# Patient Record
Sex: Female | Born: 1965 | Race: Black or African American | Hispanic: No | Marital: Single | Smoking: Never smoker
Health system: Southern US, Community
[De-identification: ages and names within clinical notes are randomized; demographics above are authoritative.]

## PROBLEM LIST (undated history)

## (undated) ENCOUNTER — Ambulatory Visit (HOSPITAL_COMMUNITY): Discharge status: Absent without leave | Payer: BC Managed Care – PPO

## (undated) DIAGNOSIS — K219 Gastro-esophageal reflux disease without esophagitis: Secondary | ICD-10-CM

## (undated) DIAGNOSIS — M5432 Sciatica, left side: Secondary | ICD-10-CM

## (undated) DIAGNOSIS — J309 Allergic rhinitis, unspecified: Secondary | ICD-10-CM

## (undated) DIAGNOSIS — E559 Vitamin D deficiency, unspecified: Secondary | ICD-10-CM

## (undated) DIAGNOSIS — I10 Essential (primary) hypertension: Secondary | ICD-10-CM

## (undated) DIAGNOSIS — G5600 Carpal tunnel syndrome, unspecified upper limb: Secondary | ICD-10-CM

## (undated) HISTORY — DX: Allergic rhinitis, unspecified: J30.9

## (undated) HISTORY — DX: Sciatica, left side: M54.32

## (undated) HISTORY — DX: Carpal tunnel syndrome, unspecified upper limb: G56.00

## (undated) HISTORY — PX: BREAST BIOPSY: SHX20

## (undated) HISTORY — DX: Gastro-esophageal reflux disease without esophagitis: K21.9

## (undated) HISTORY — DX: Morbid (severe) obesity due to excess calories: E66.01

## (undated) HISTORY — DX: Vitamin D deficiency, unspecified: E55.9

## (undated) HISTORY — DX: Essential (primary) hypertension: I10

---

## 1999-03-11 ENCOUNTER — Ambulatory Visit (HOSPITAL_COMMUNITY): Admission: RE | Admit: 1999-03-11 | Discharge: 1999-03-11 | Payer: Self-pay | Admitting: Gastroenterology

## 1999-04-19 ENCOUNTER — Other Ambulatory Visit: Admission: RE | Admit: 1999-04-19 | Discharge: 1999-04-19 | Payer: Self-pay | Admitting: Obstetrics and Gynecology

## 1999-05-13 ENCOUNTER — Other Ambulatory Visit: Admission: RE | Admit: 1999-05-13 | Discharge: 1999-05-13 | Payer: Self-pay | Admitting: Obstetrics and Gynecology

## 1999-05-13 ENCOUNTER — Encounter (INDEPENDENT_AMBULATORY_CARE_PROVIDER_SITE_OTHER): Payer: Self-pay | Admitting: Specialist

## 1999-09-03 ENCOUNTER — Other Ambulatory Visit: Admission: RE | Admit: 1999-09-03 | Discharge: 1999-09-03 | Payer: Self-pay | Admitting: Obstetrics and Gynecology

## 1999-09-06 ENCOUNTER — Other Ambulatory Visit: Admission: RE | Admit: 1999-09-06 | Discharge: 1999-09-06 | Payer: Self-pay | Admitting: Obstetrics and Gynecology

## 1999-09-06 ENCOUNTER — Encounter (INDEPENDENT_AMBULATORY_CARE_PROVIDER_SITE_OTHER): Payer: Self-pay | Admitting: Specialist

## 2000-01-10 ENCOUNTER — Other Ambulatory Visit: Admission: RE | Admit: 2000-01-10 | Discharge: 2000-01-10 | Payer: Self-pay | Admitting: Obstetrics and Gynecology

## 2000-06-16 ENCOUNTER — Other Ambulatory Visit: Admission: RE | Admit: 2000-06-16 | Discharge: 2000-06-16 | Payer: Self-pay | Admitting: Obstetrics and Gynecology

## 2001-01-02 ENCOUNTER — Other Ambulatory Visit: Admission: RE | Admit: 2001-01-02 | Discharge: 2001-01-02 | Payer: Self-pay | Admitting: Obstetrics and Gynecology

## 2002-08-12 ENCOUNTER — Other Ambulatory Visit: Admission: RE | Admit: 2002-08-12 | Discharge: 2002-08-12 | Payer: Self-pay | Admitting: Obstetrics and Gynecology

## 2003-10-18 ENCOUNTER — Emergency Department (HOSPITAL_COMMUNITY): Admission: EM | Admit: 2003-10-18 | Discharge: 2003-10-18 | Payer: Self-pay | Admitting: Emergency Medicine

## 2004-04-28 ENCOUNTER — Other Ambulatory Visit: Admission: RE | Admit: 2004-04-28 | Discharge: 2004-04-28 | Payer: Self-pay | Admitting: Family Medicine

## 2005-11-13 ENCOUNTER — Emergency Department (HOSPITAL_COMMUNITY): Admission: EM | Admit: 2005-11-13 | Discharge: 2005-11-13 | Payer: Self-pay | Admitting: Emergency Medicine

## 2006-10-11 ENCOUNTER — Emergency Department (HOSPITAL_COMMUNITY): Admission: EM | Admit: 2006-10-11 | Discharge: 2006-10-11 | Payer: Self-pay | Admitting: Emergency Medicine

## 2007-11-02 ENCOUNTER — Emergency Department (HOSPITAL_COMMUNITY): Admission: EM | Admit: 2007-11-02 | Discharge: 2007-11-02 | Payer: Self-pay | Admitting: Family Medicine

## 2008-05-15 ENCOUNTER — Emergency Department (HOSPITAL_COMMUNITY): Admission: EM | Admit: 2008-05-15 | Discharge: 2008-05-15 | Payer: Self-pay | Admitting: Family Medicine

## 2008-05-21 ENCOUNTER — Encounter: Admission: RE | Admit: 2008-05-21 | Discharge: 2008-05-21 | Payer: Self-pay | Admitting: Family Medicine

## 2008-05-21 IMAGING — CR DG CERVICAL SPINE COMPLETE 4+V
8 of 10 series · 8 of 10 positions shown · non-contrast
Comparison: None

CLINICAL DATA: Severe neck pain.

CERVICAL SPINE - COMPLETE 4+ VIEW

[view not recorded (1 of 8)]
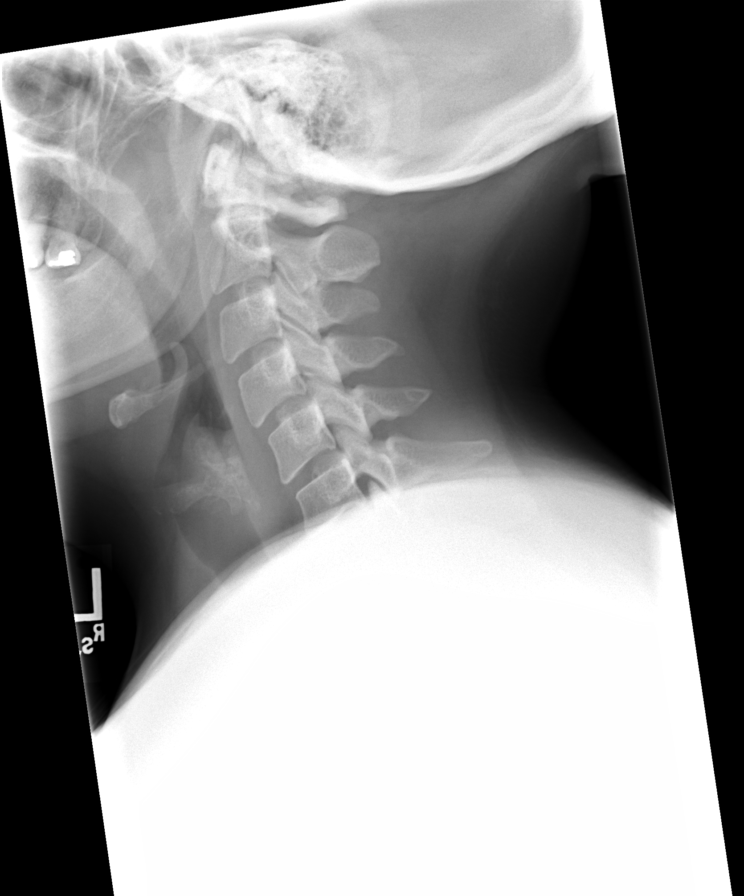

[view not recorded (2 of 8)]
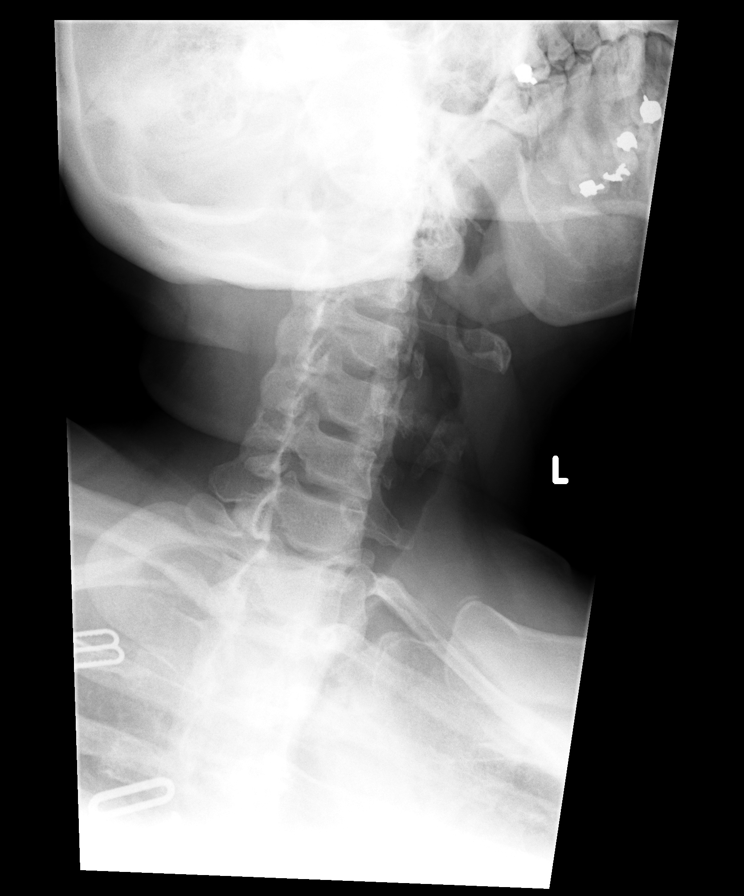

[view not recorded (3 of 8)]
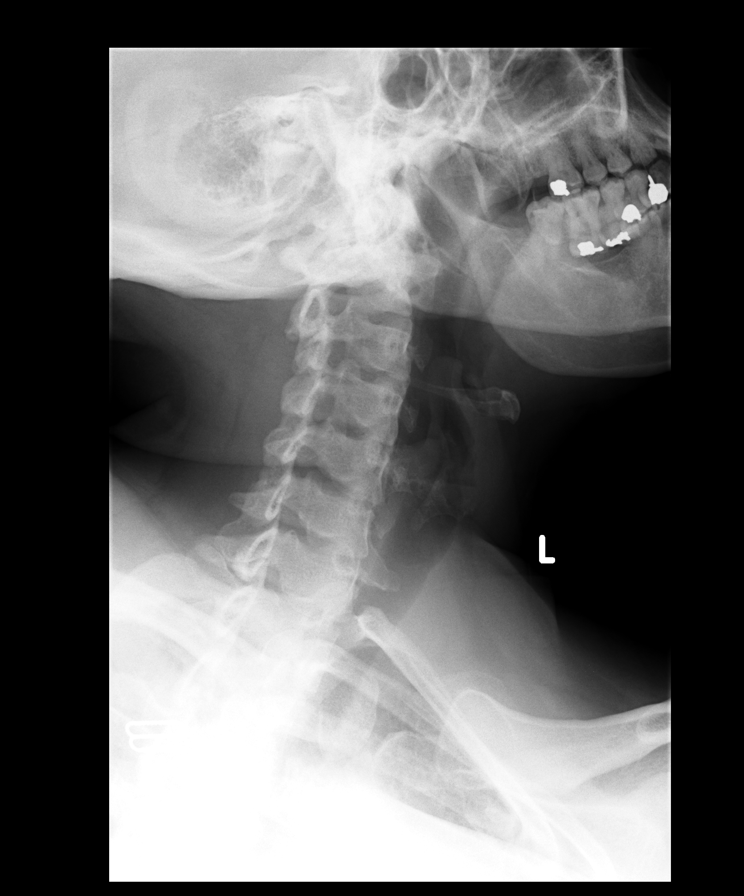

[view not recorded (4 of 8)]
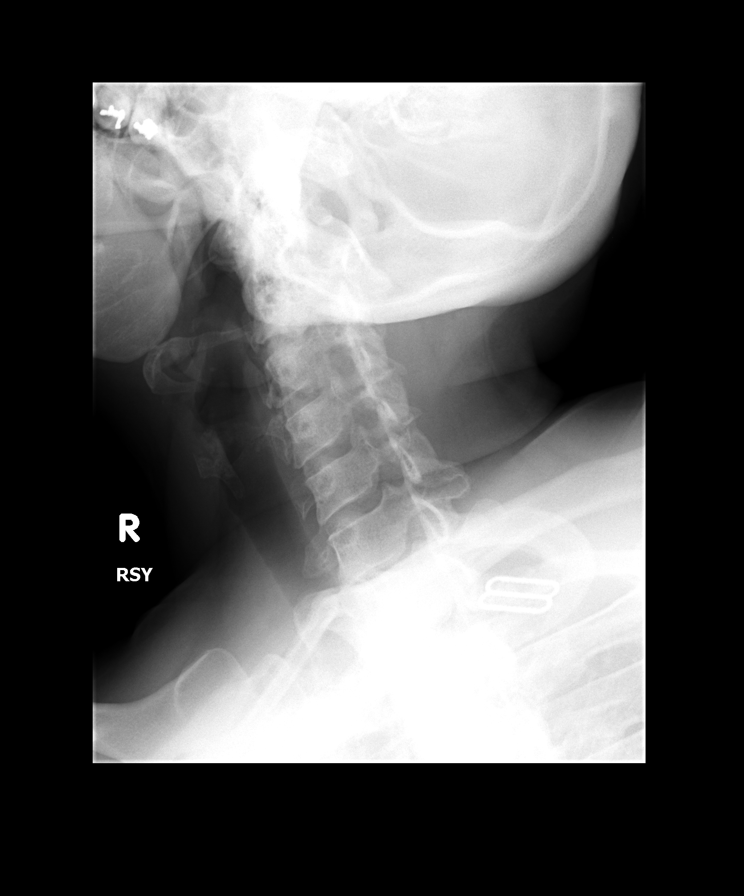

[view not recorded (5 of 8)]
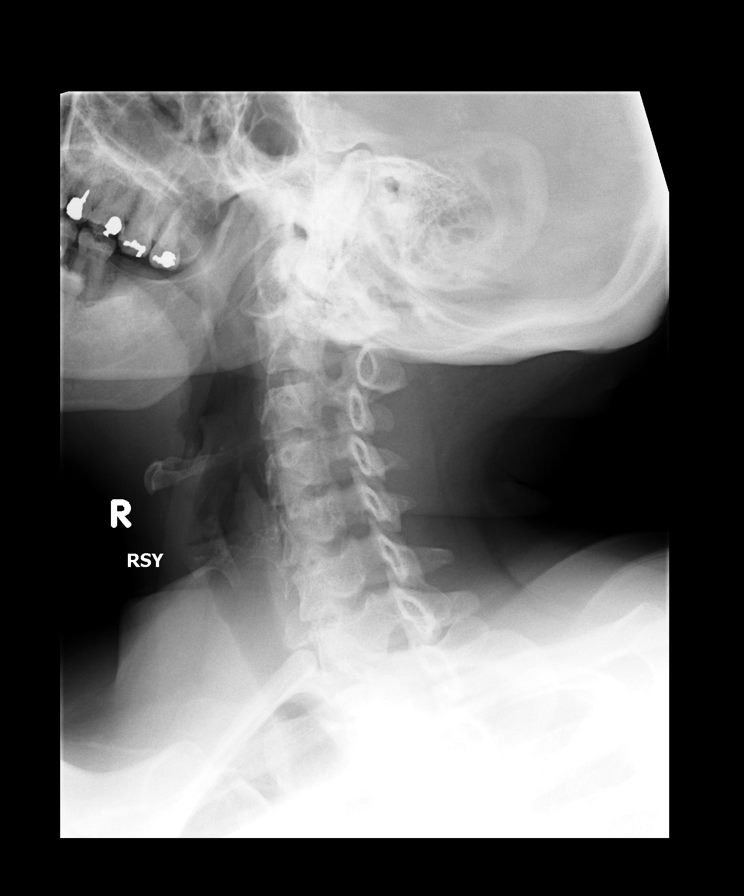

[view not recorded (6 of 8)]
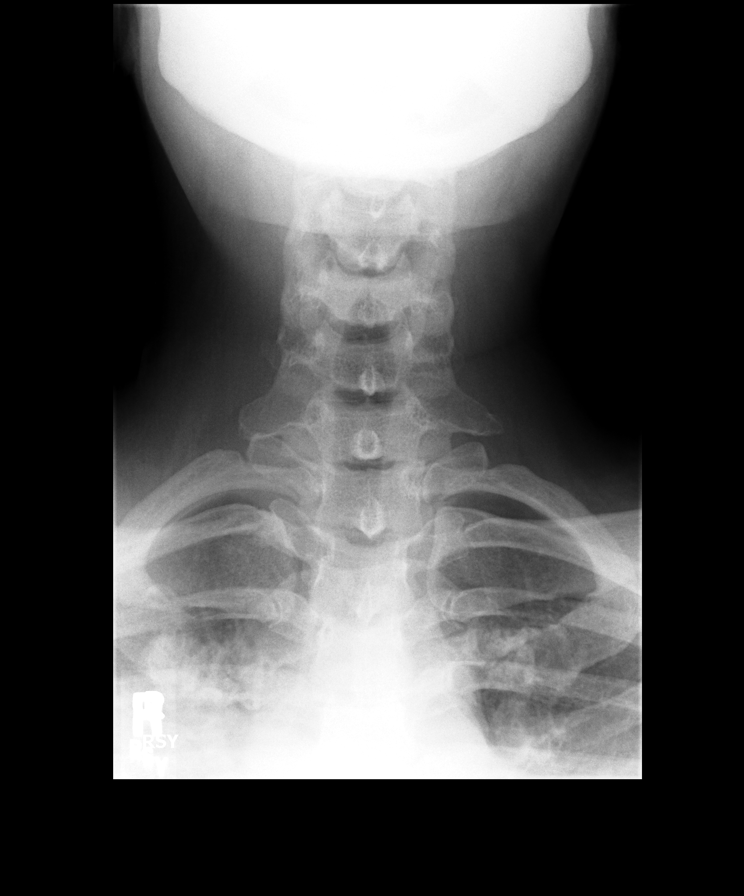

[view not recorded (7 of 8)]
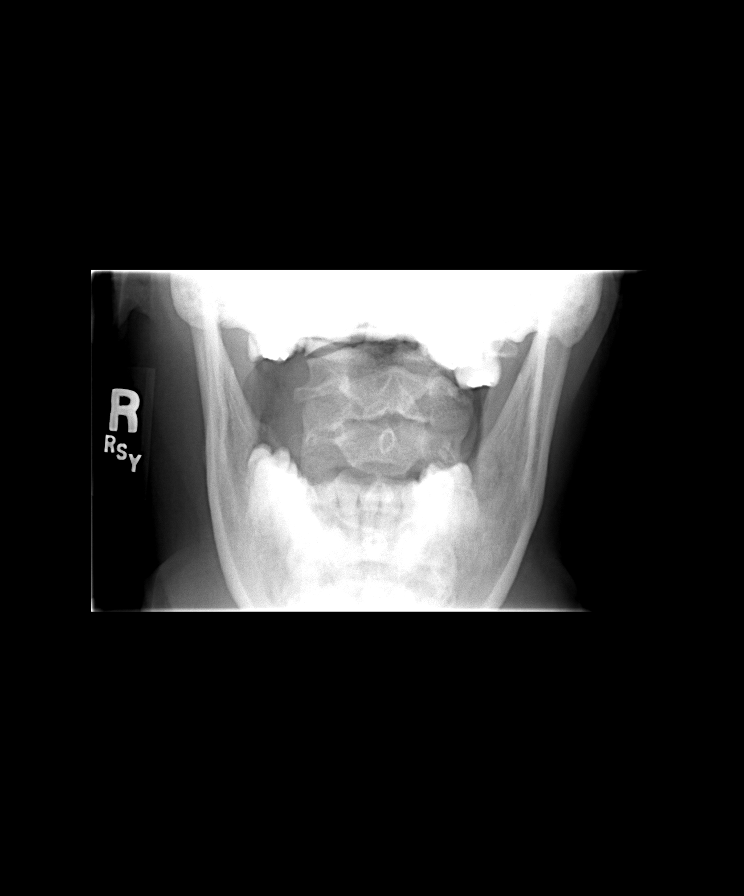

[view not recorded (8 of 8)]
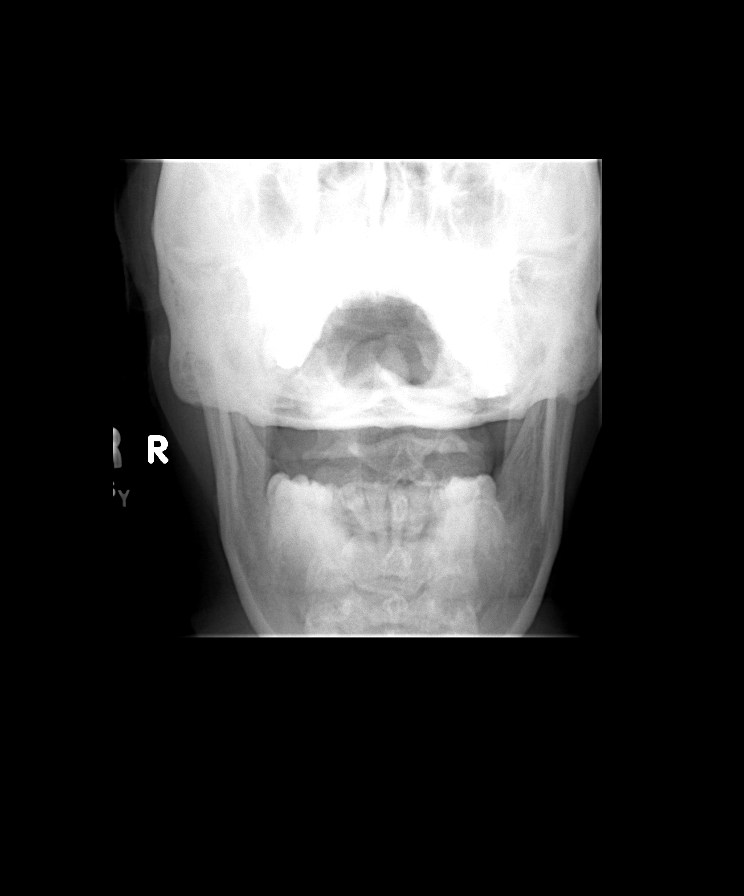

[8 of 10 positions shown; findings below may reference images not displayed]

FINDINGS: Cervical spine is visualized from the occiput to the C7-
T1 junction.  Prevertebral soft tissues are within normal limits,
alignment is anatomic and there is no fracture.  Neural foramina
appear patent.  Dens is obscured on the dedicated views.
Visualized lung bases show rounded densities in the medial right
upper lung zone.
IMPRESSION: 1.  Rounded densities in the medial right upper lung zone.  Two-
view chest x-ray is recommended in further initial evaluation, as
clinically indicated.
2.  No fracture or subluxation.  No acute findings in the neck.

## 2008-05-21 IMAGING — CR DG CHEST 2V
2 series · 2 of 2 positions shown · non-contrast
Comparison: [DATE]

CLINICAL DATA: Possible right upper lung nodule shown on cervical
spine radiographs.

CHEST - 2 VIEW

[view not recorded (1 of 2)]
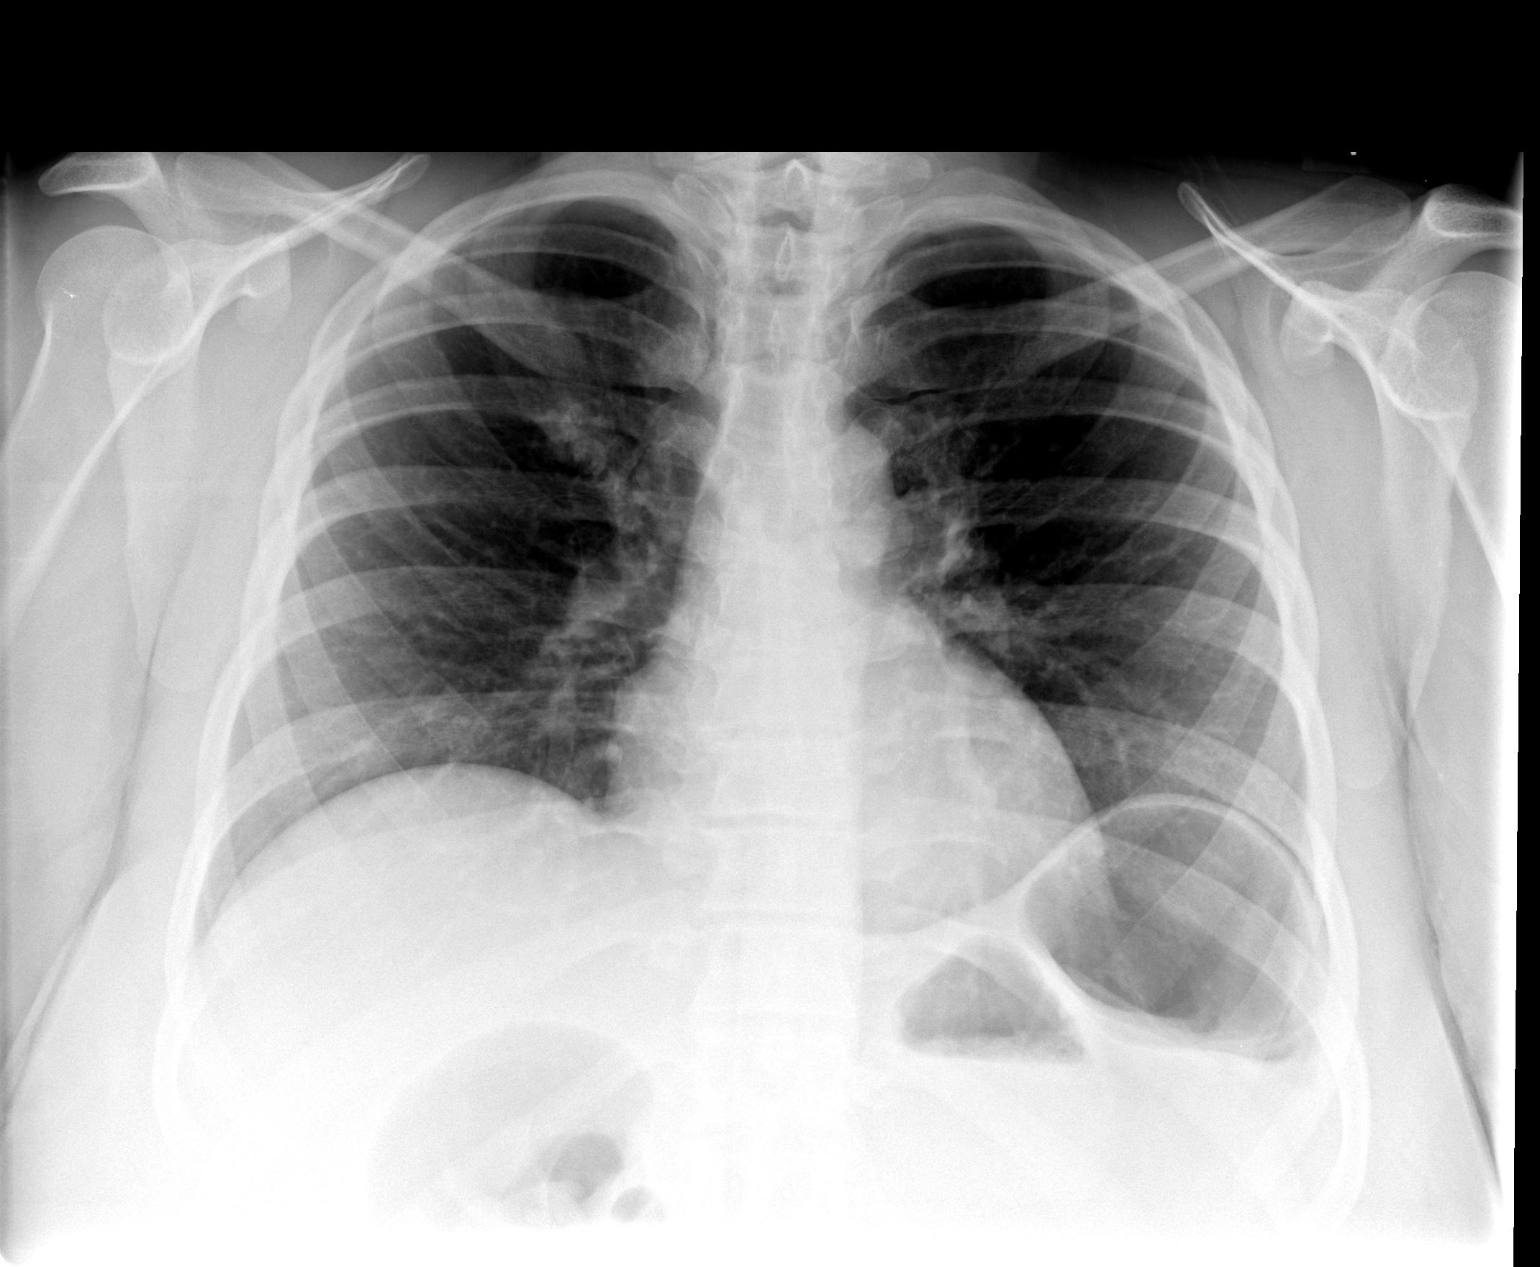

[view not recorded (2 of 2)]
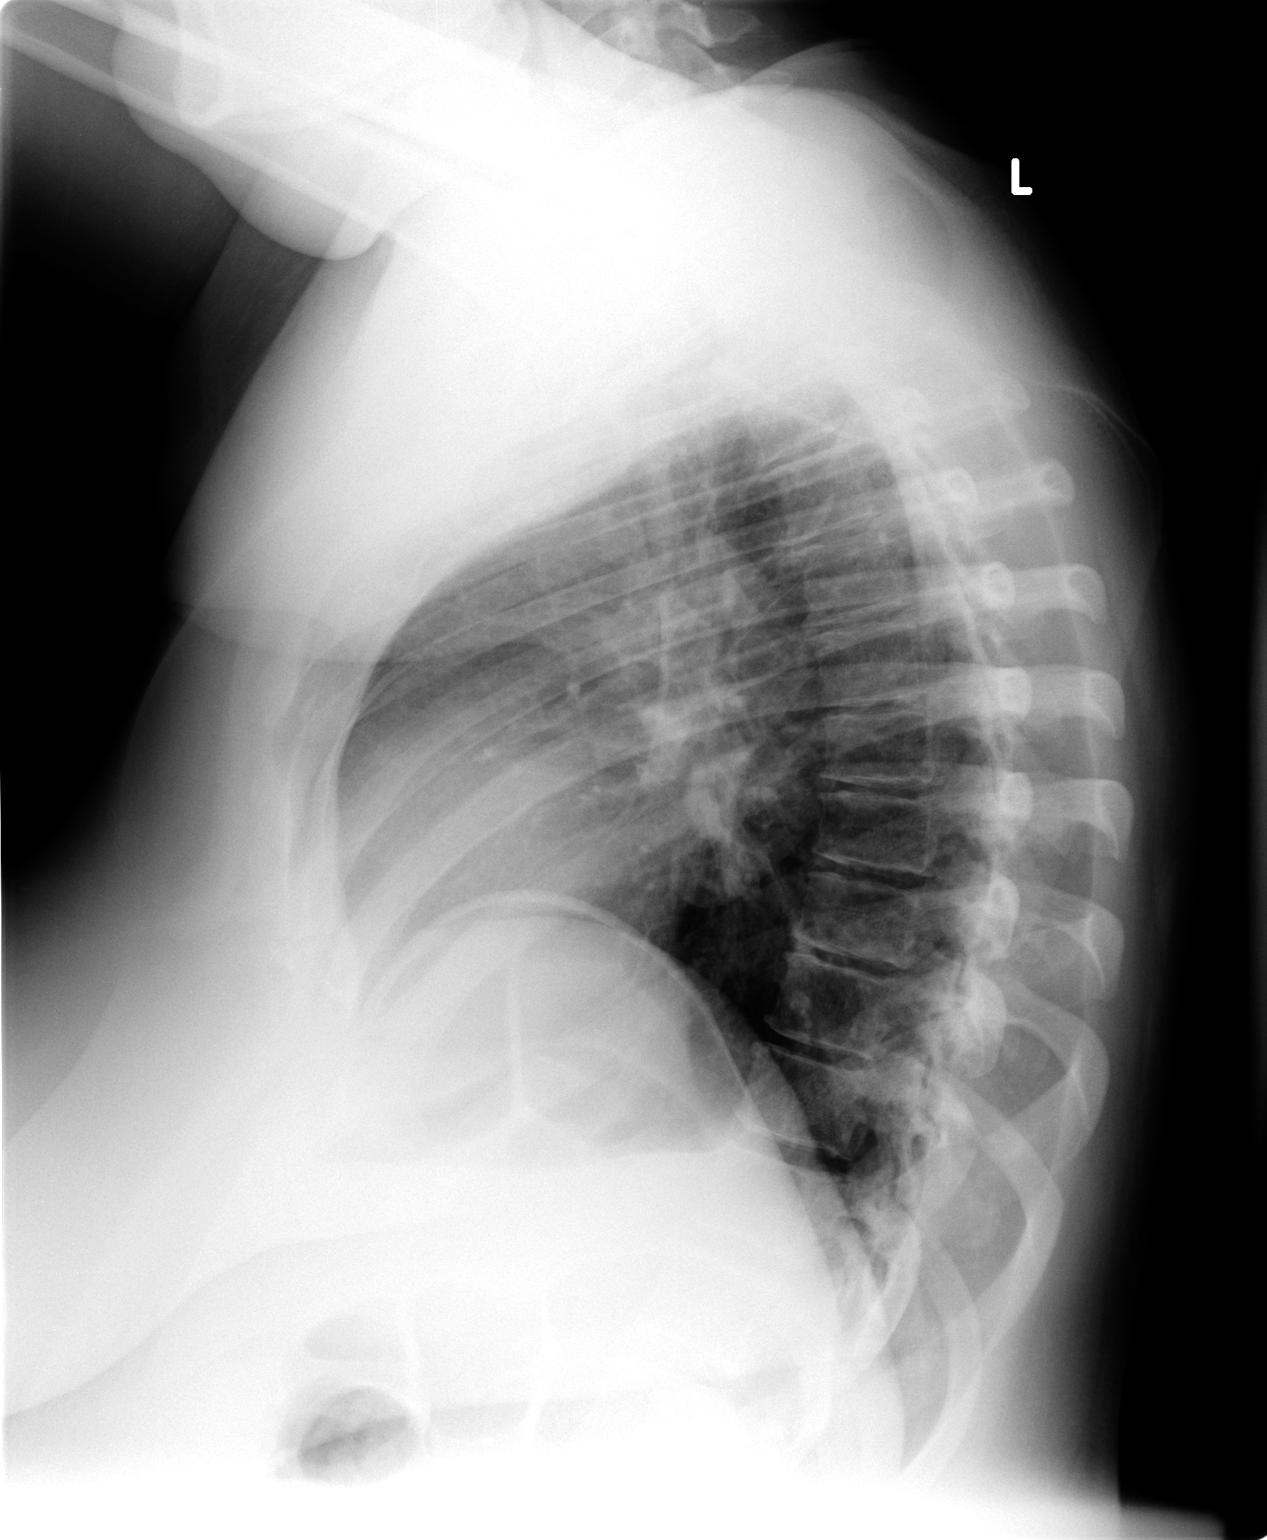

[2 of 2 positions shown; findings below may reference images not displayed]

FINDINGS: Continued nodular appearance over the right first rib and
likely represents a combination of bony and osseous shadows, but is
asymmetric and an underlying pulmonary nodule is not confidently
excluded.  Accordingly, CT the chest is recommended

The lungs appear otherwise clear.  No pleural effusion identified.
Cardiac and mediastinal margins appear normal.
IMPRESSION: 1.  Continued nodular appearance over the right first rib band,
statistically likely to represent confluence of bony and osseous
shadows, but for which a true intrapulmonary nodule cannot be
excluded.  CT of the chest is recommended for definitive workup.

## 2008-05-22 ENCOUNTER — Encounter: Admission: RE | Admit: 2008-05-22 | Discharge: 2008-05-22 | Payer: Self-pay | Admitting: Family Medicine

## 2008-05-22 IMAGING — CT CT CHEST W/ CM
2 of 3 series · 15 of 36 positions shown, 18 images · IV contrast (75CC OMNI 300)
Comparison: [REDACTED] cervical spine radiographs and chest x-ray
[DATE]

CLINICAL DATA: Question right suprahilar upper lobe nodule [REDACTED]
cervical spine radiographs [DATE] and chest x-ray [DATE].

CT CHEST WITH CONTRAST
TECHNIQUE: Multidetector CT imaging of the chest was performed
following the standard protocol during bolus administration of
intravenous contrast.
Contrast: 75 ml [UB]

[Series 2: routine chest · axial · 0.70mm/px · z∈[-226,+14]mm · 12 of 58 slices shown, 15 images]
[im 5/58  mediastinal]
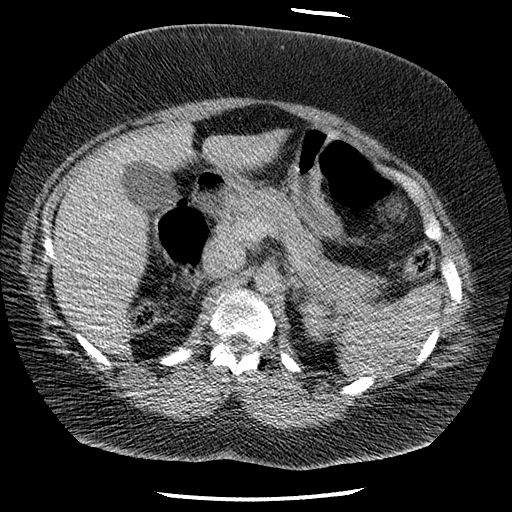
[im 5/58  lung]
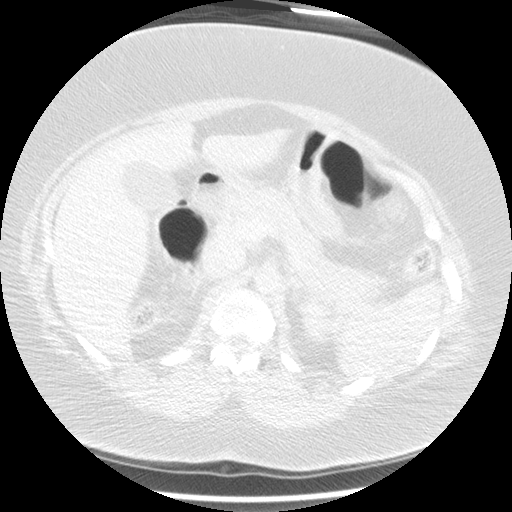
[im 9/58  lung]
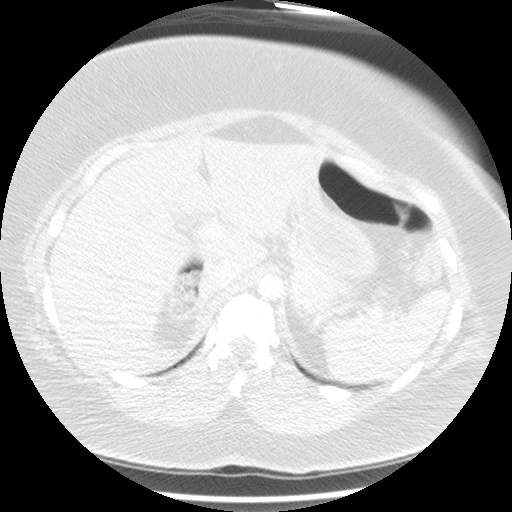
[im 13/58  lung]
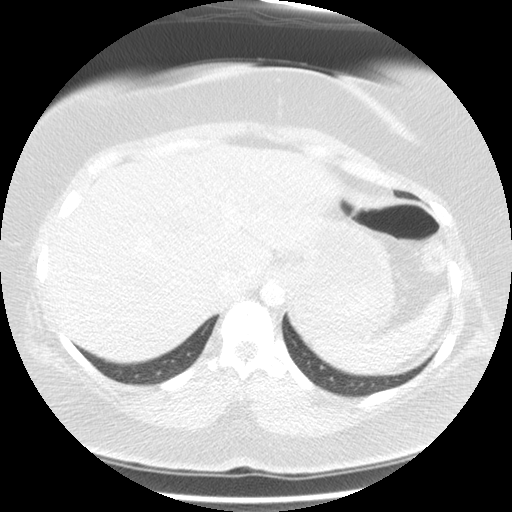
[im 17/58  lung]
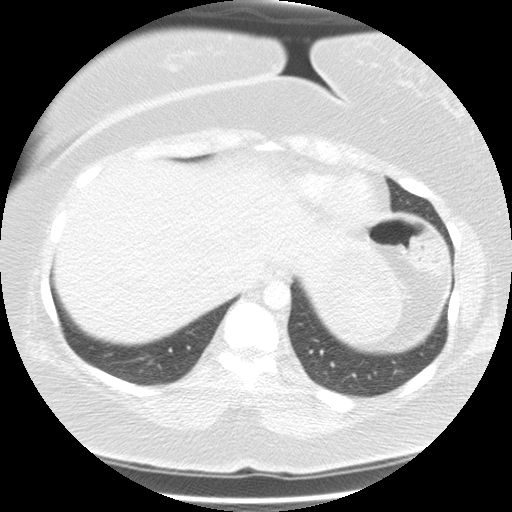
[im 22/58  mediastinal]
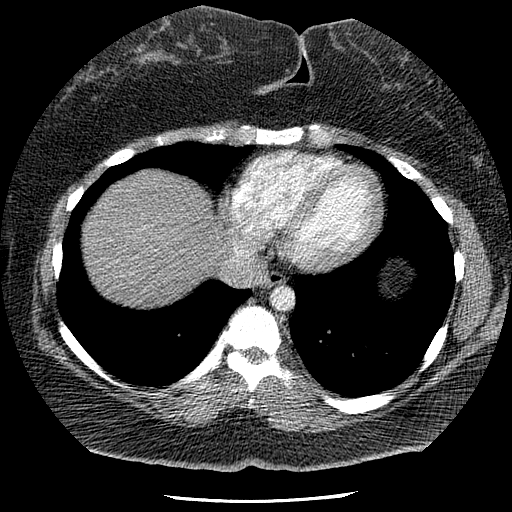
[im 22/58  lung]
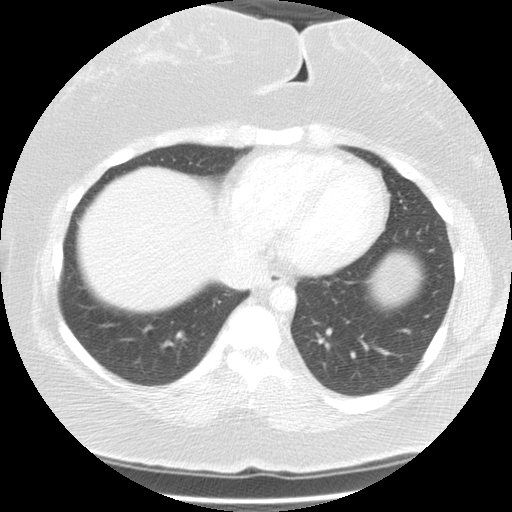
[im 26/58  lung]
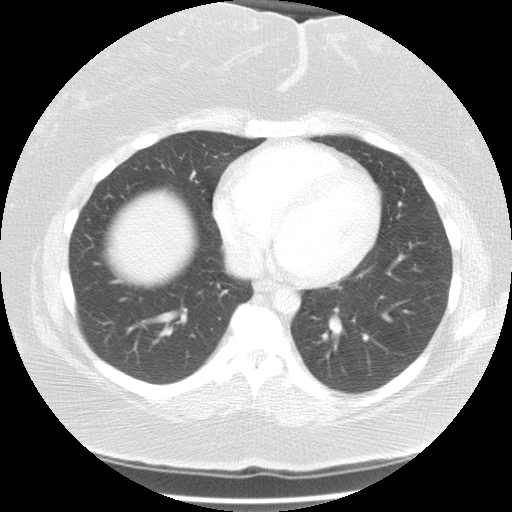
[im 32/58  lung]
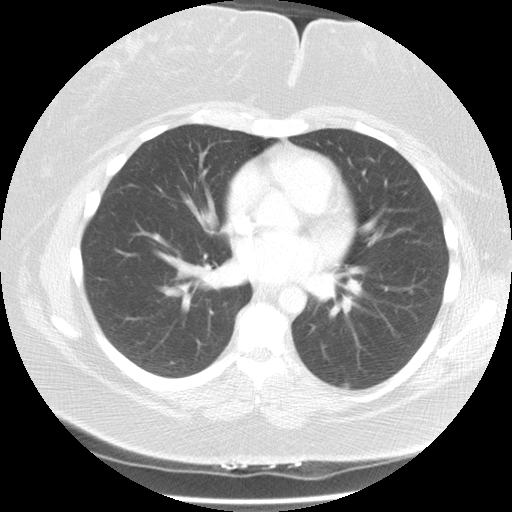
[im 36/58  lung]
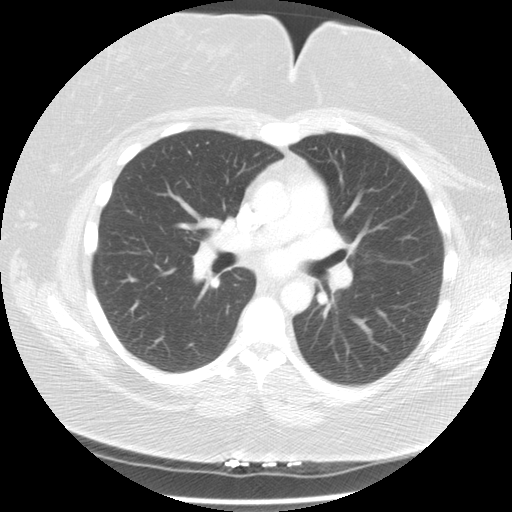
[im 41/58  mediastinal]
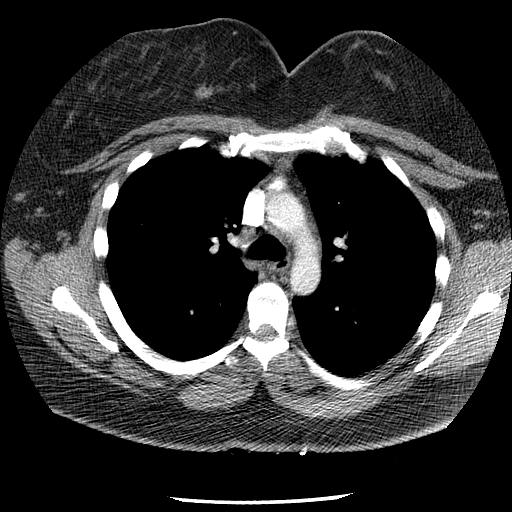
[im 41/58  lung]
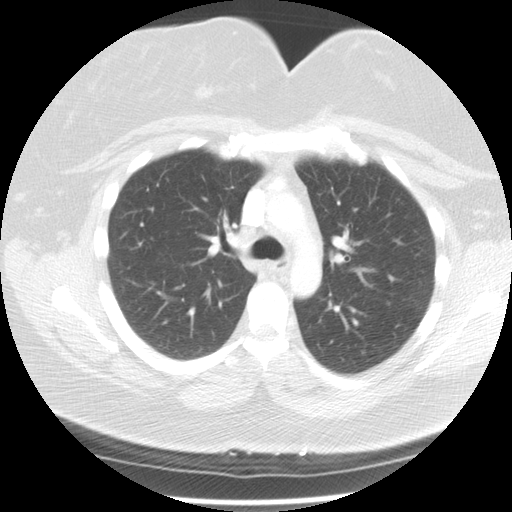
[im 45/58  lung]
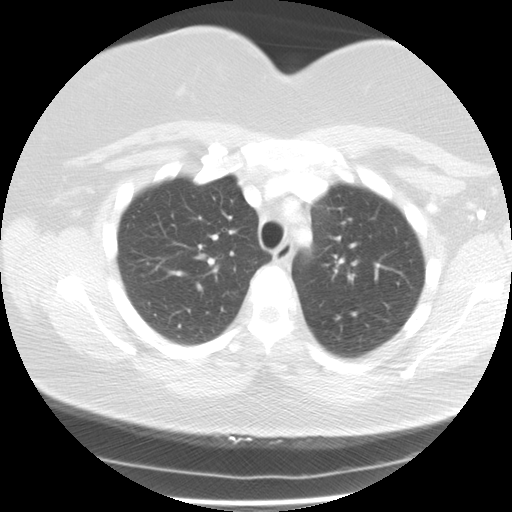
[im 49/58  lung]
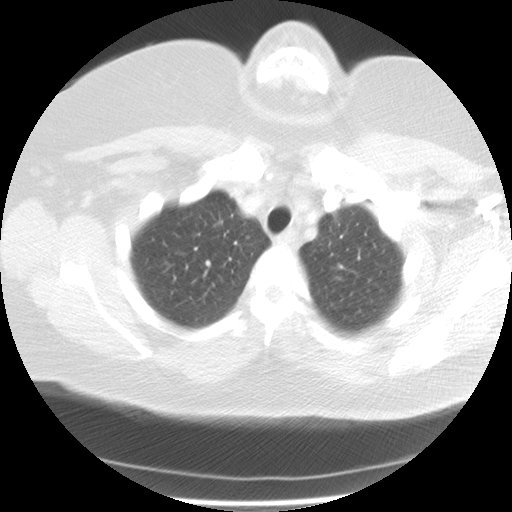
[im 53/58  lung]
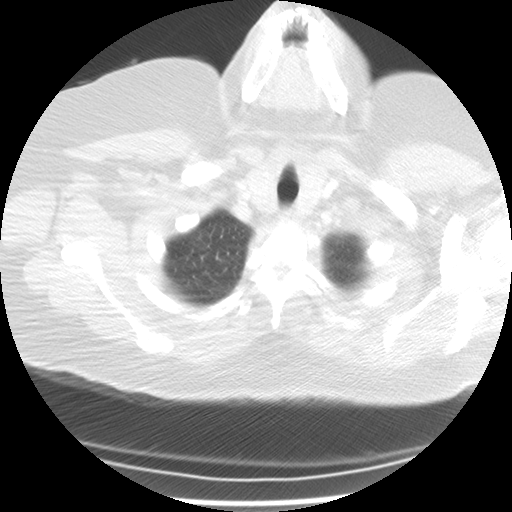

[Series 400: cor · coronal · 0.70mm/px · 3 of 156 slices shown]
[im 32/156  lung]
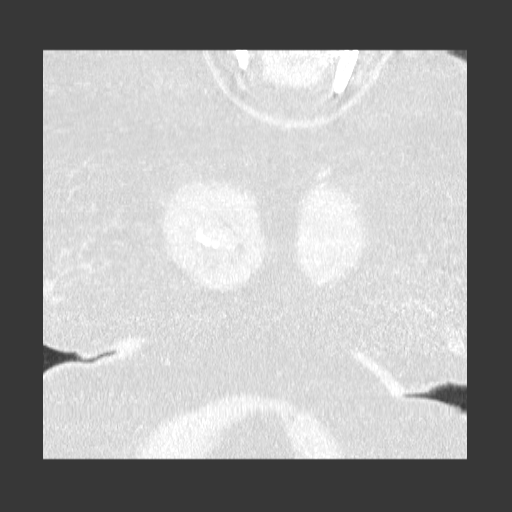
[im 63/156  lung]
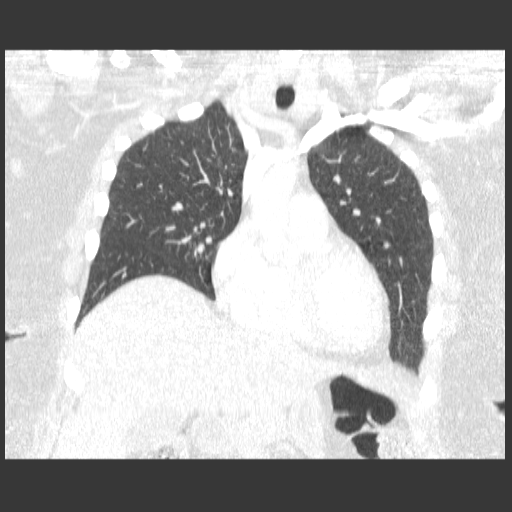
[im 94/156  lung]
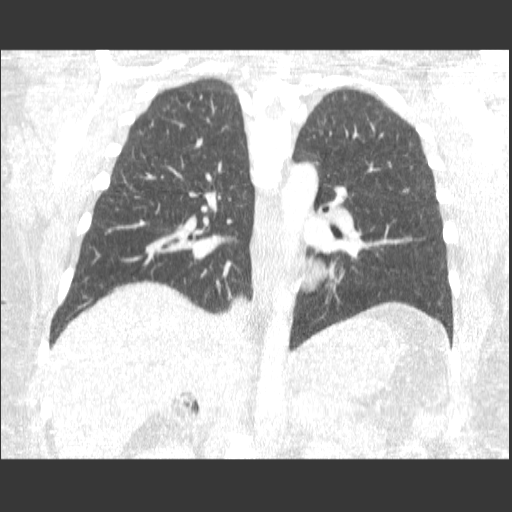

[15 of 36 positions shown; findings below may reference images not displayed]

FINDINGS: Lungs are clear.  Previous radiographic findings are due
to confluence of benign costochondral cartilage calcification at
the anterior right first rib and normal lung vasculature.  No
mediastinal, hilar or axillary mass/adenopathy is seen.  No
significant osseous lesions are noted.  Heart size is normal.
Upper abdominal organs appear normal..
IMPRESSION: Negative.  (Previous finding due to summation rib and vascular
shadows).

## 2008-06-04 ENCOUNTER — Encounter: Admission: RE | Admit: 2008-06-04 | Discharge: 2008-06-25 | Payer: Self-pay | Admitting: Family Medicine

## 2011-11-07 ENCOUNTER — Other Ambulatory Visit: Payer: Self-pay | Admitting: Obstetrics and Gynecology

## 2011-11-07 DIAGNOSIS — Z1231 Encounter for screening mammogram for malignant neoplasm of breast: Secondary | ICD-10-CM

## 2011-11-17 ENCOUNTER — Ambulatory Visit
Admission: RE | Admit: 2011-11-17 | Discharge: 2011-11-17 | Disposition: A | Discharge status: Home / Self Care | Payer: BC Managed Care – PPO | Source: Ambulatory Visit | Attending: Obstetrics and Gynecology | Admitting: Obstetrics and Gynecology

## 2011-11-17 DIAGNOSIS — Z1231 Encounter for screening mammogram for malignant neoplasm of breast: Secondary | ICD-10-CM

## 2011-11-17 IMAGING — MG MM DIGITAL SCREENING BILAT
4 series · 4 of 4 positions shown · non-contrast
Comparison: none

DG SCREEN MAMMOGRAM BILATERAL
Bilateral CC and MLO view(s) were taken.

DIGITAL SCREENING MAMMOGRAM WITH CAD:
The breast tissue is heterogeneously dense.  Possible asymmetry is noted in the left breast.  Spot 
compression views and possibly sonography are recommended for further evaluation.  In the right 
breast, no masses or malignant type calcifications are identified.
Images were processed with CAD.

[R CC]
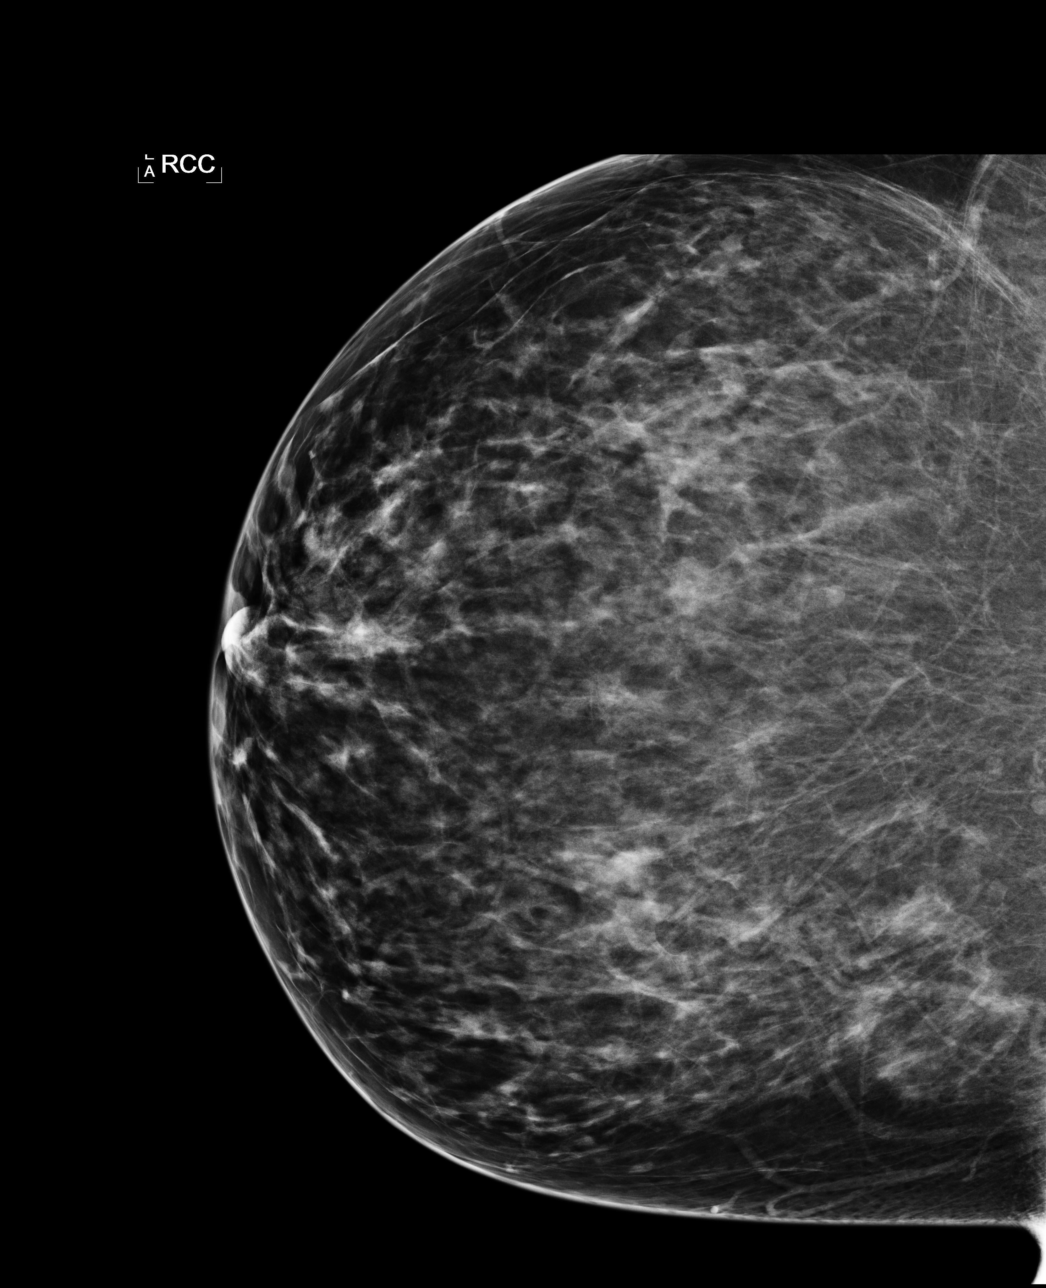

[L CC]
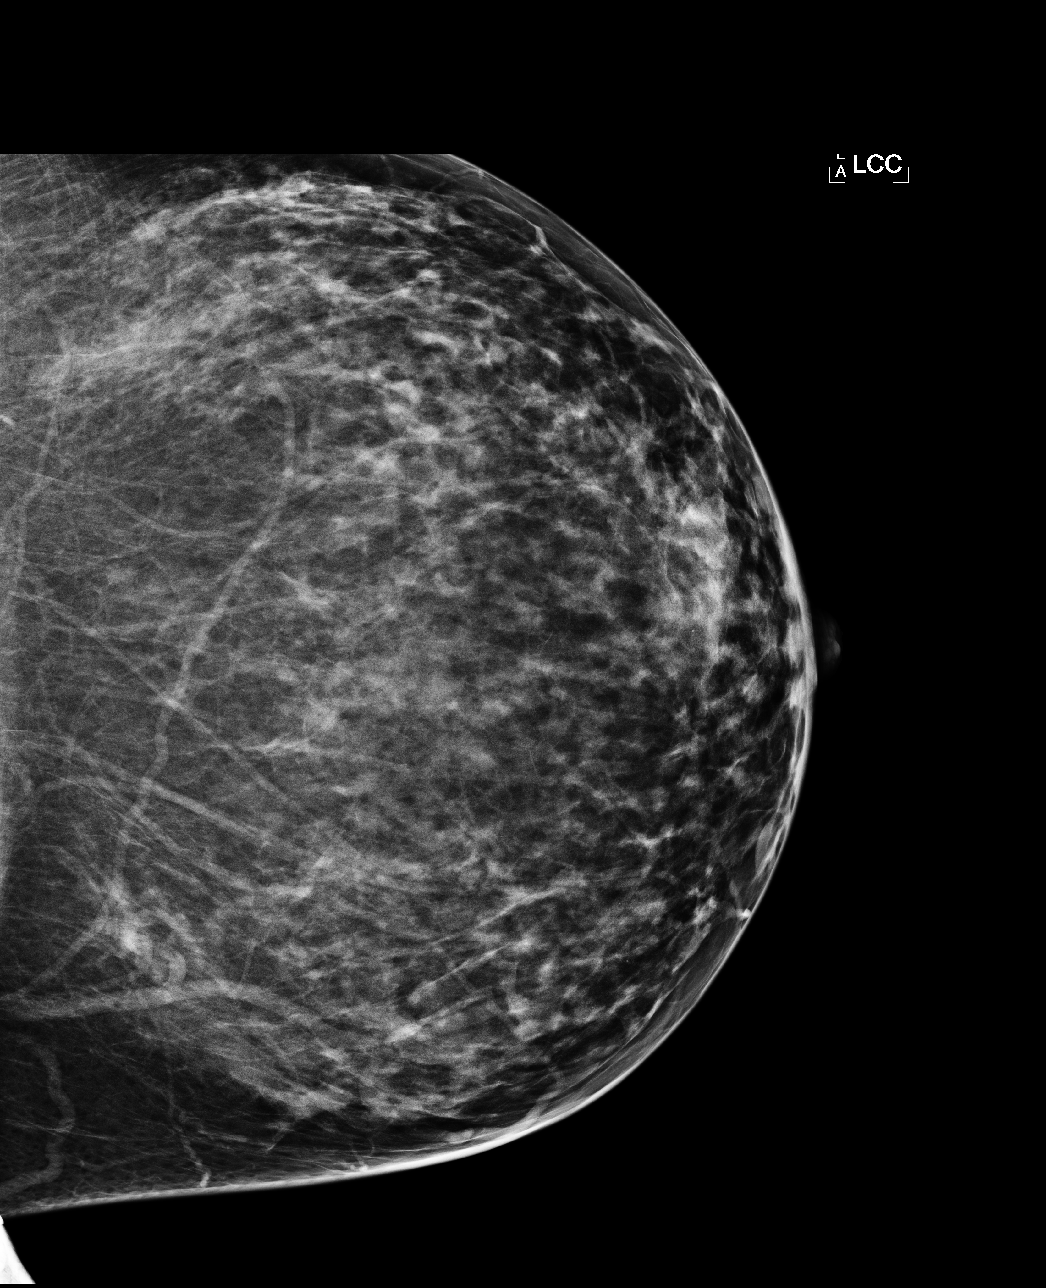

[L MLO]
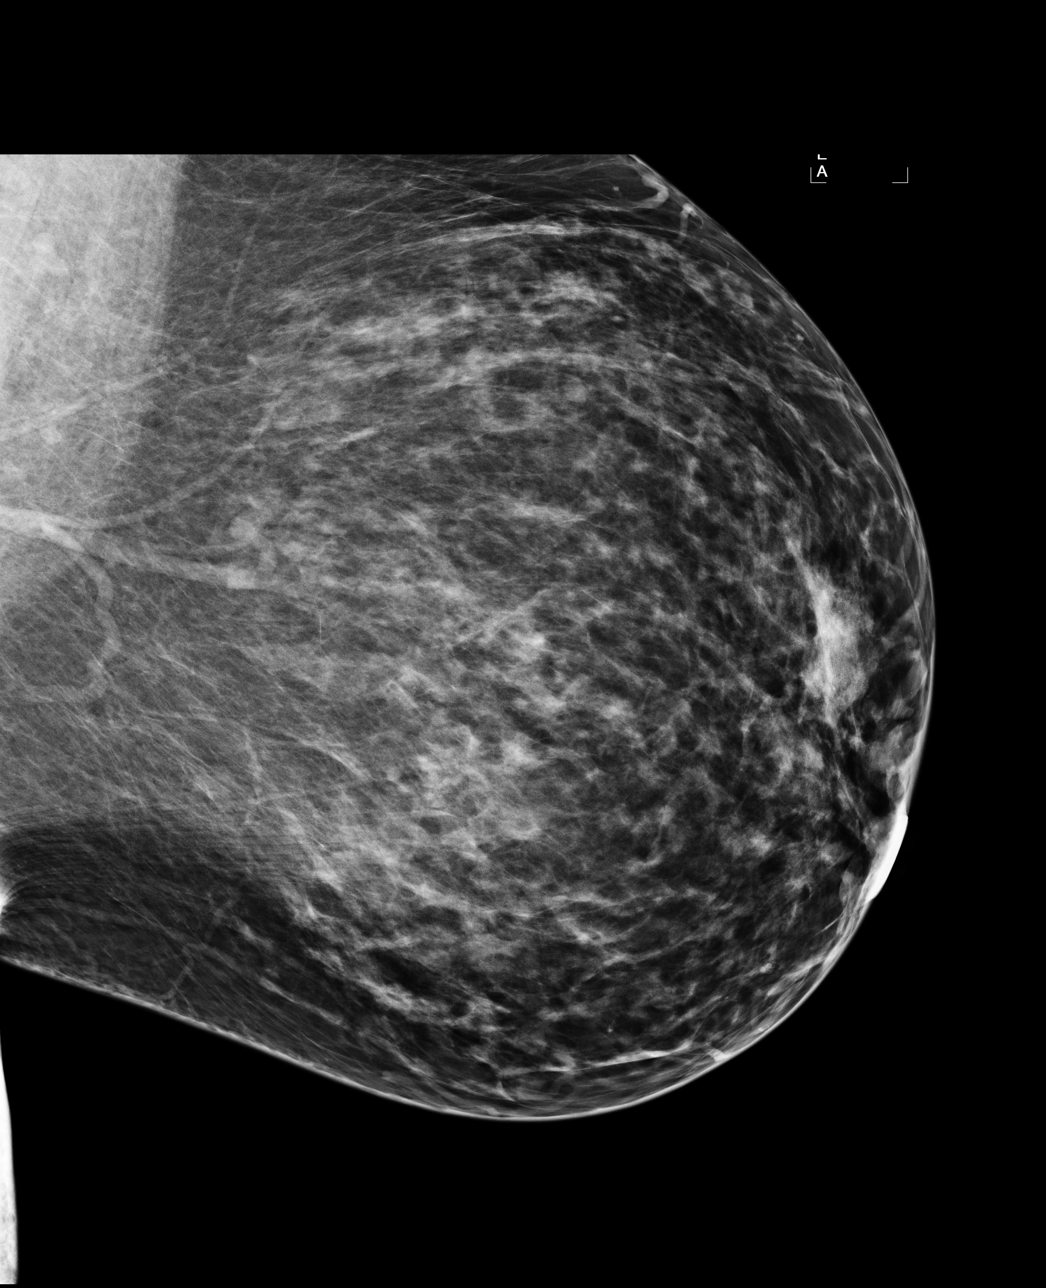

[R MLO]
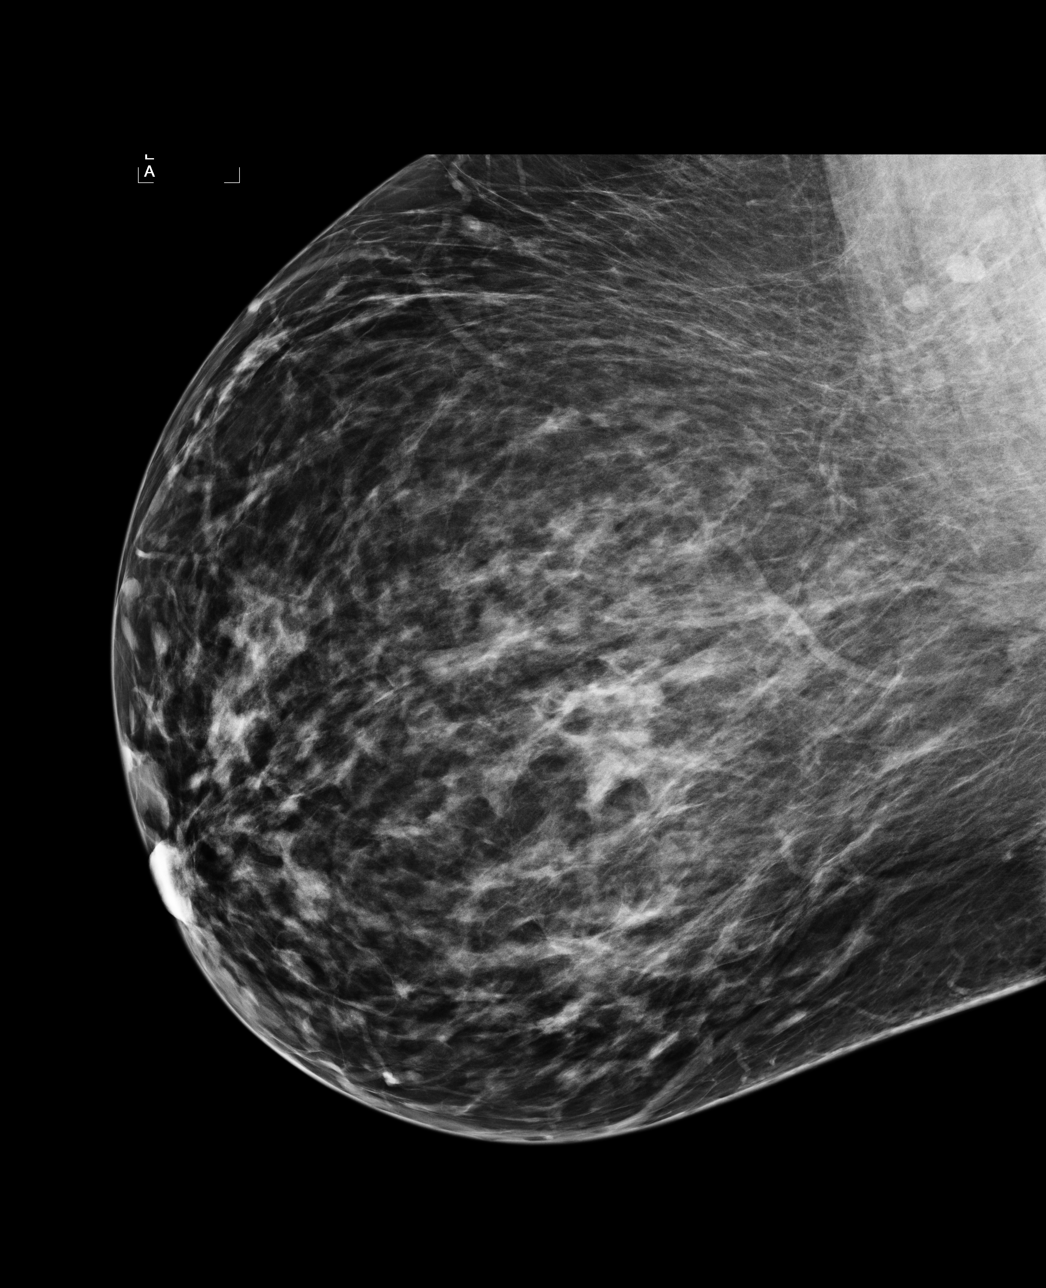

[4 of 4 positions shown; findings below may reference images not displayed]

IMPRESSION: Possible asymmetry, left breast.  Additional evaluation is indicated.  The patient will be 
contacted for additional studies and a supplementary report will follow.  No specific mammographic 
evidence of malignancy, right breast.

ASSESSMENT: Need additional imaging evaluation and/or prior mammograms for comparison - BI-RADS 0

Further imaging of the left breast.
,

## 2011-11-22 ENCOUNTER — Other Ambulatory Visit: Payer: Self-pay | Admitting: Obstetrics and Gynecology

## 2011-11-22 DIAGNOSIS — R928 Other abnormal and inconclusive findings on diagnostic imaging of breast: Secondary | ICD-10-CM

## 2011-12-01 ENCOUNTER — Ambulatory Visit
Admission: RE | Admit: 2011-12-01 | Discharge: 2011-12-01 | Disposition: A | Discharge status: Home / Self Care | Payer: BC Managed Care – PPO | Source: Ambulatory Visit | Attending: Obstetrics and Gynecology | Admitting: Obstetrics and Gynecology

## 2011-12-01 ENCOUNTER — Other Ambulatory Visit: Payer: Self-pay | Admitting: Obstetrics and Gynecology

## 2011-12-01 DIAGNOSIS — R928 Other abnormal and inconclusive findings on diagnostic imaging of breast: Secondary | ICD-10-CM

## 2011-12-01 DIAGNOSIS — N63 Unspecified lump in unspecified breast: Secondary | ICD-10-CM

## 2011-12-02 IMAGING — MG MM DIGITAL DIAGNOSTIC LIMITED*L*
3 series · 3 of 3 positions shown · non-contrast
Comparison: [DATE]

CLINICAL DATA: Further evaluation of left asymmetry

DIGITAL DIAGNOSTIC LEFT MAMMOGRAM  AND LEFT BREAST ULTRASOUND:

[L CC (1 of 2)]
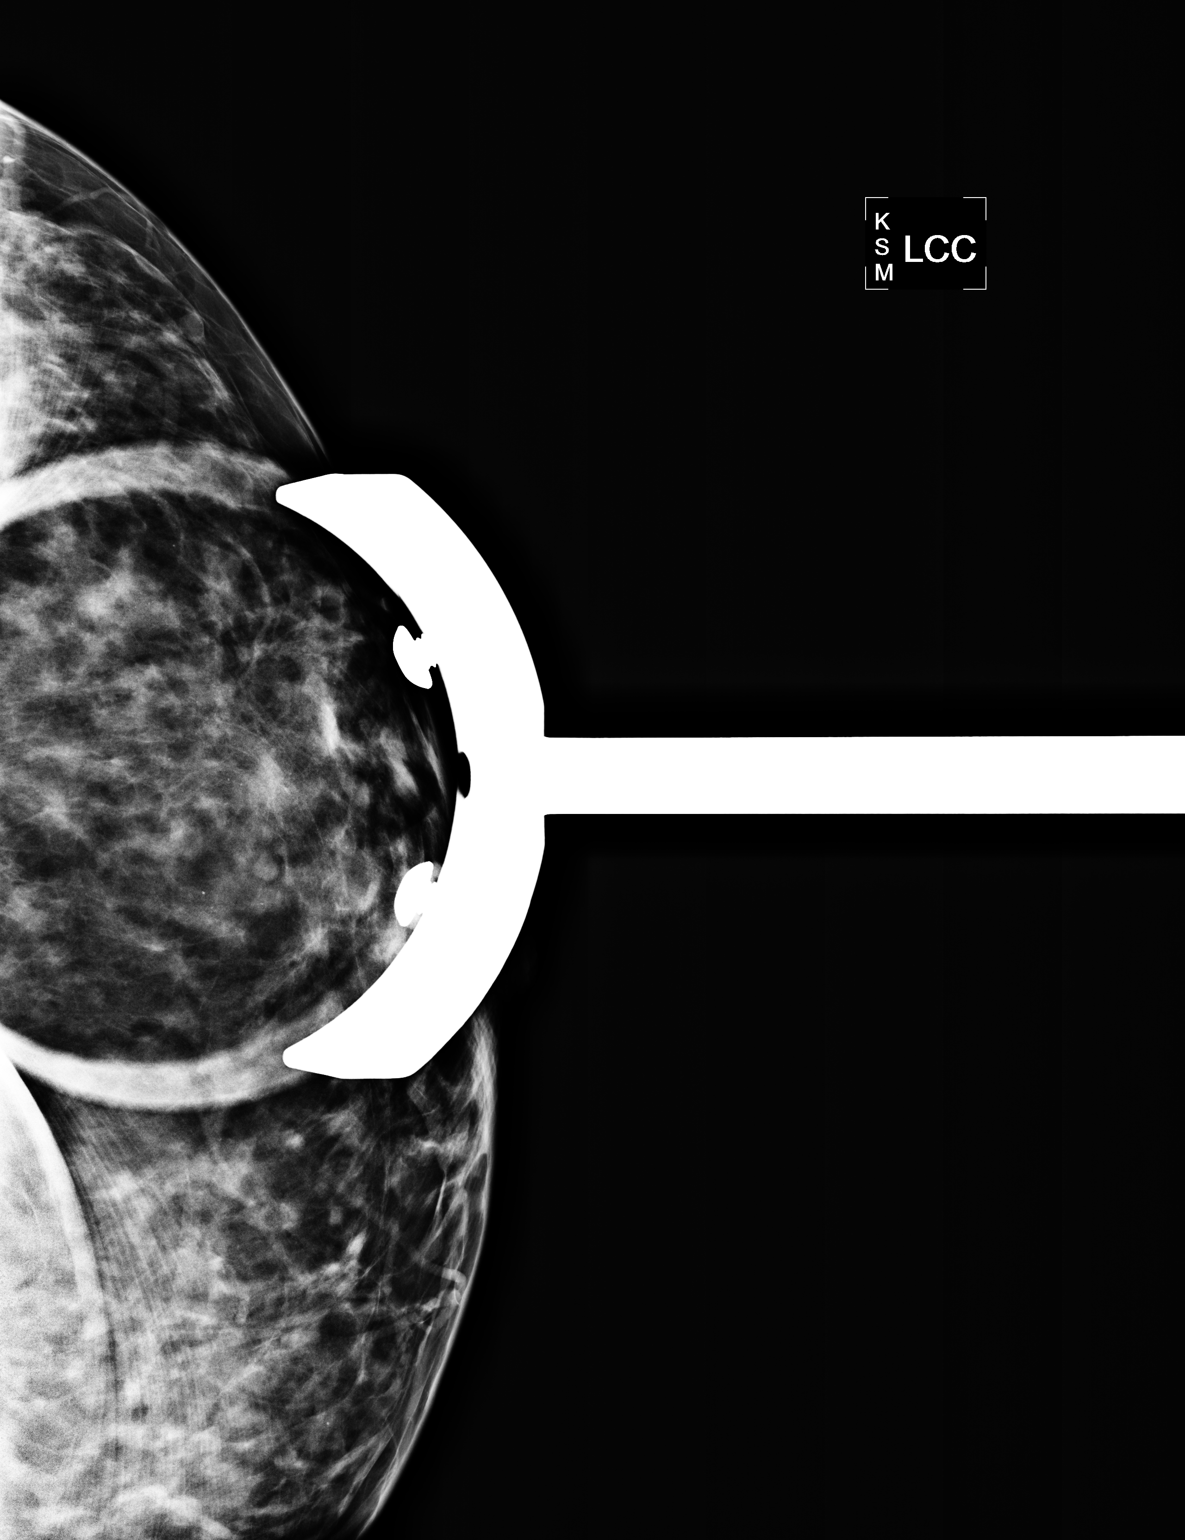

[L MLO]
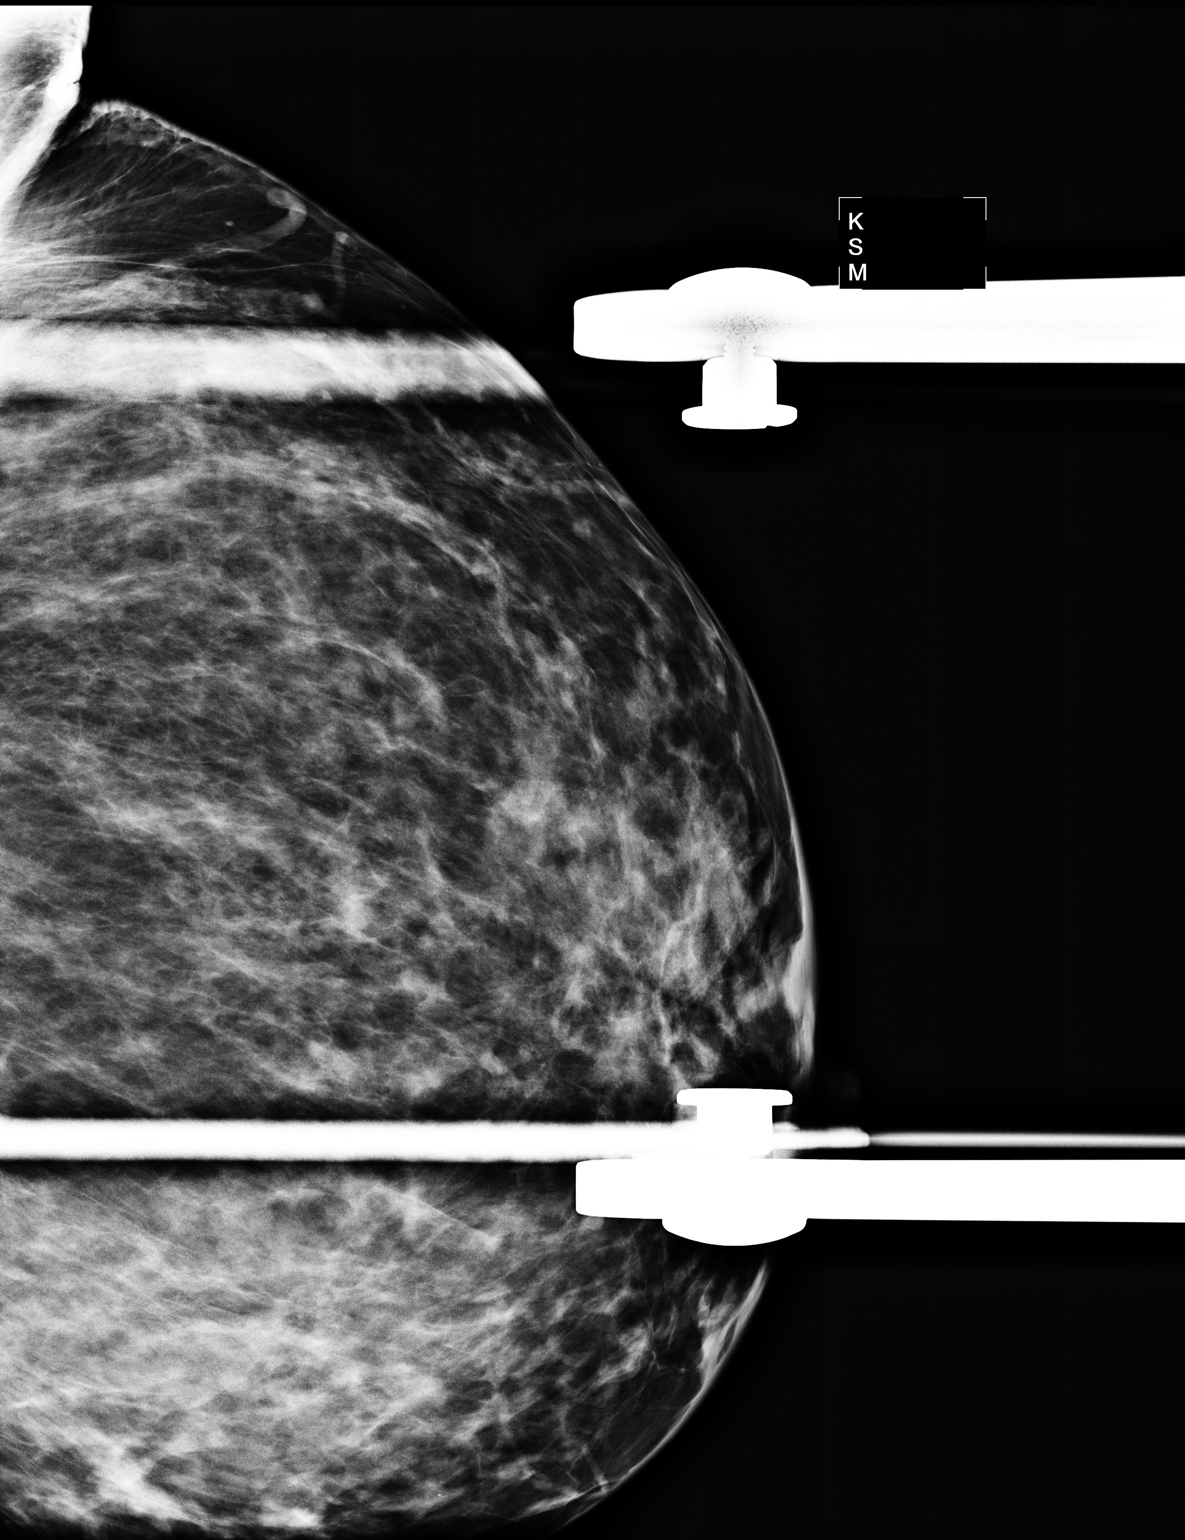

[L CC (2 of 2)]
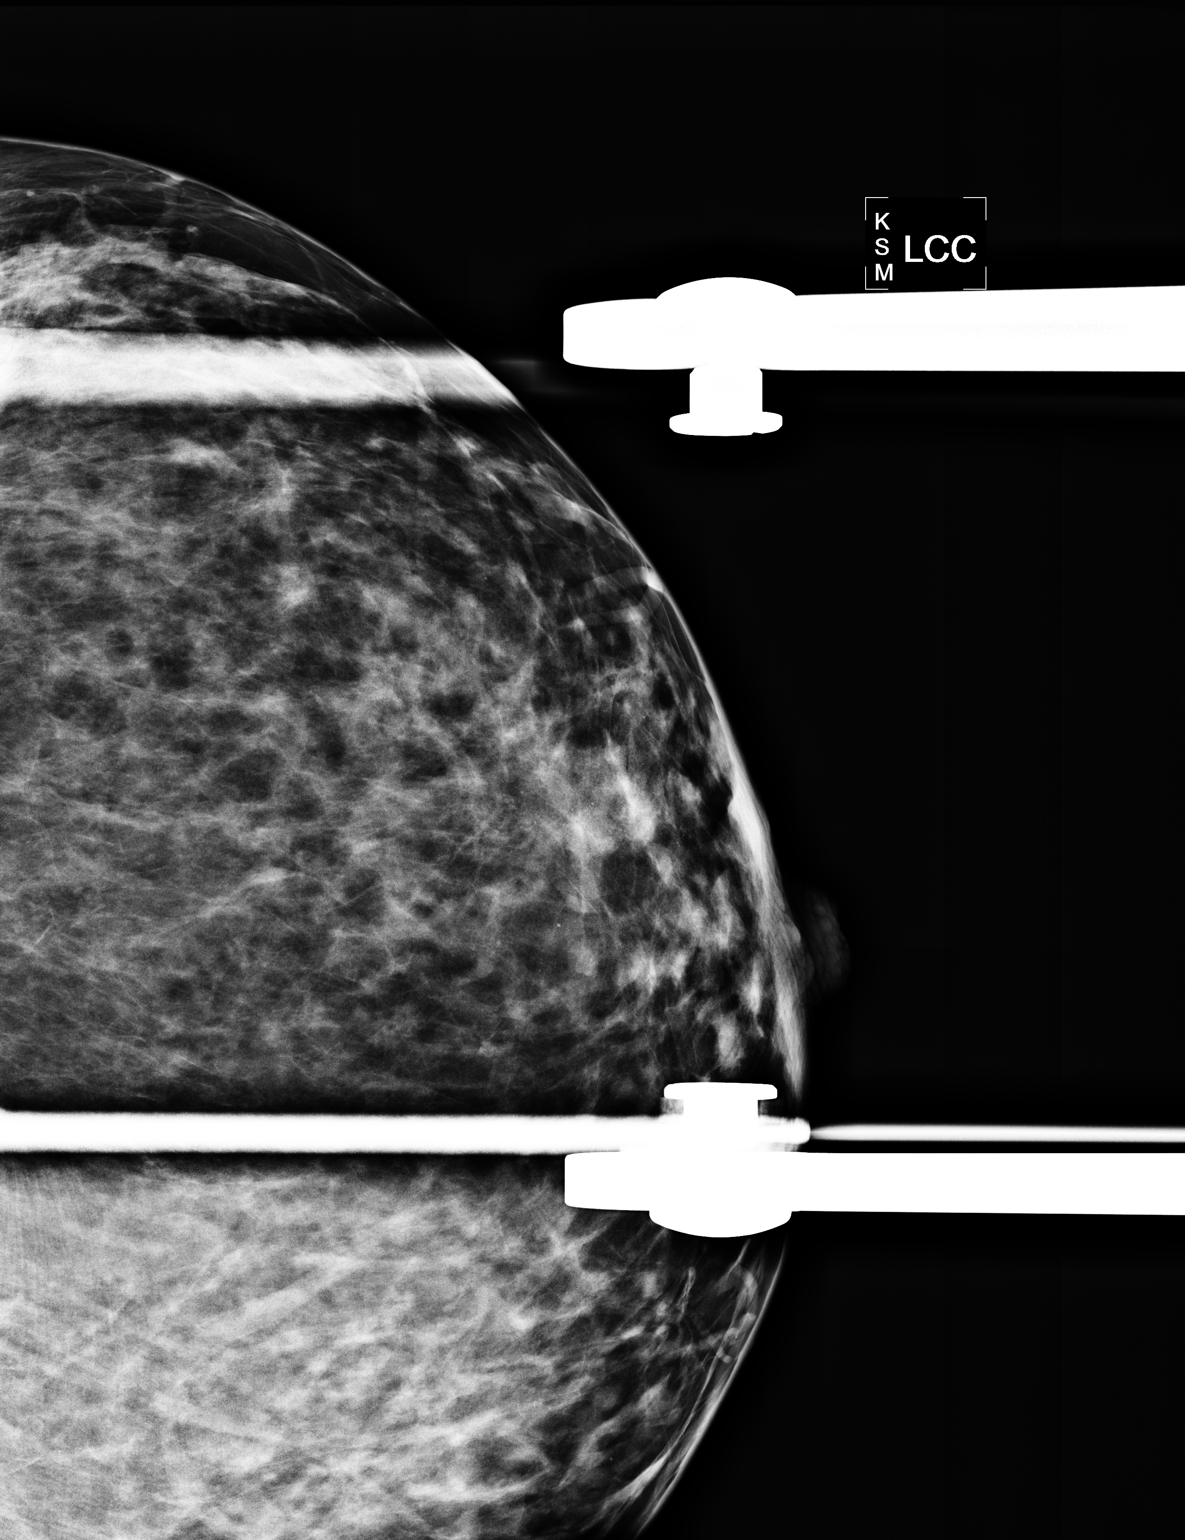

[3 of 3 positions shown; findings below may reference images not displayed]

FINDINGS: No mass appreciated, nor architectural distortion, but
there does persist retroareolar asymmetric density on spot
compression views.

On physical exam, there is a soft palpable approximately 1 cm mass
in the 12 o'clock position of the left breast 1 cm from the nipple.

Ultrasound is performed, showing an oval circumscribed hypoechoic
mass 1 cm from the nipple in the 12 o'clock position of the left
breast.  It is macrolobulated with mild posterior acoustic
shadowing.  It measures 14 x 6 x 11 mm.
IMPRESSION: 12 o'clock retroareolar left breast mass

BI-RADS CATEGORY 4:  Suspicious abnormality - biopsy should be
considered.

Recommendation:

The patient has scheduled a return appointment for ultrasound-
guided core needle biopsy.

## 2011-12-08 ENCOUNTER — Ambulatory Visit
Admission: RE | Admit: 2011-12-08 | Discharge: 2011-12-08 | Disposition: A | Discharge status: Home / Self Care | Payer: BC Managed Care – PPO | Source: Ambulatory Visit | Attending: Obstetrics and Gynecology | Admitting: Obstetrics and Gynecology

## 2011-12-08 DIAGNOSIS — N63 Unspecified lump in unspecified breast: Secondary | ICD-10-CM

## 2011-12-08 IMAGING — MG MM DIAGNOSTIC UNILATERAL L
2 series · 2 of 2 positions shown · non-contrast
Comparison: [DATE]

CLINICAL DATA: Status post ultrasound guided core biopsy of a mass
in the 1 o'clock location of the left breast.

DIGITAL DIAGNOSTIC LEFT MAMMOGRAM

[L CC]
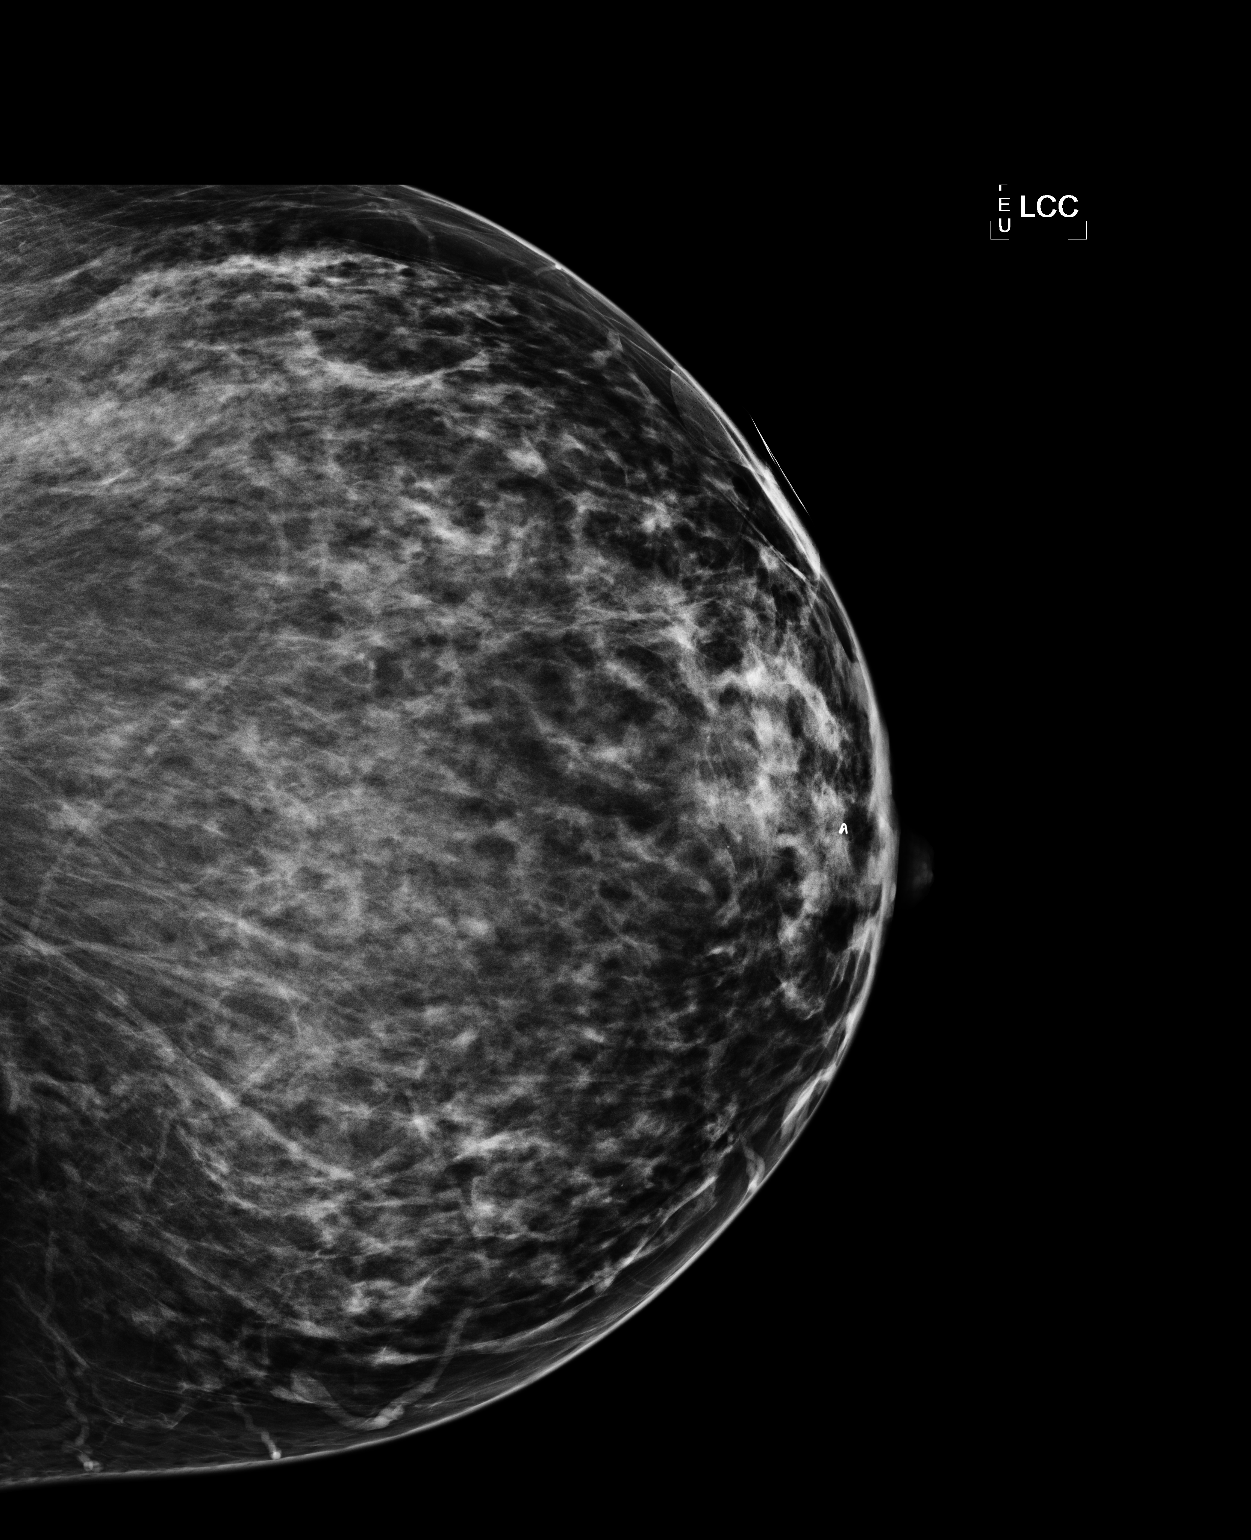

[L ML]
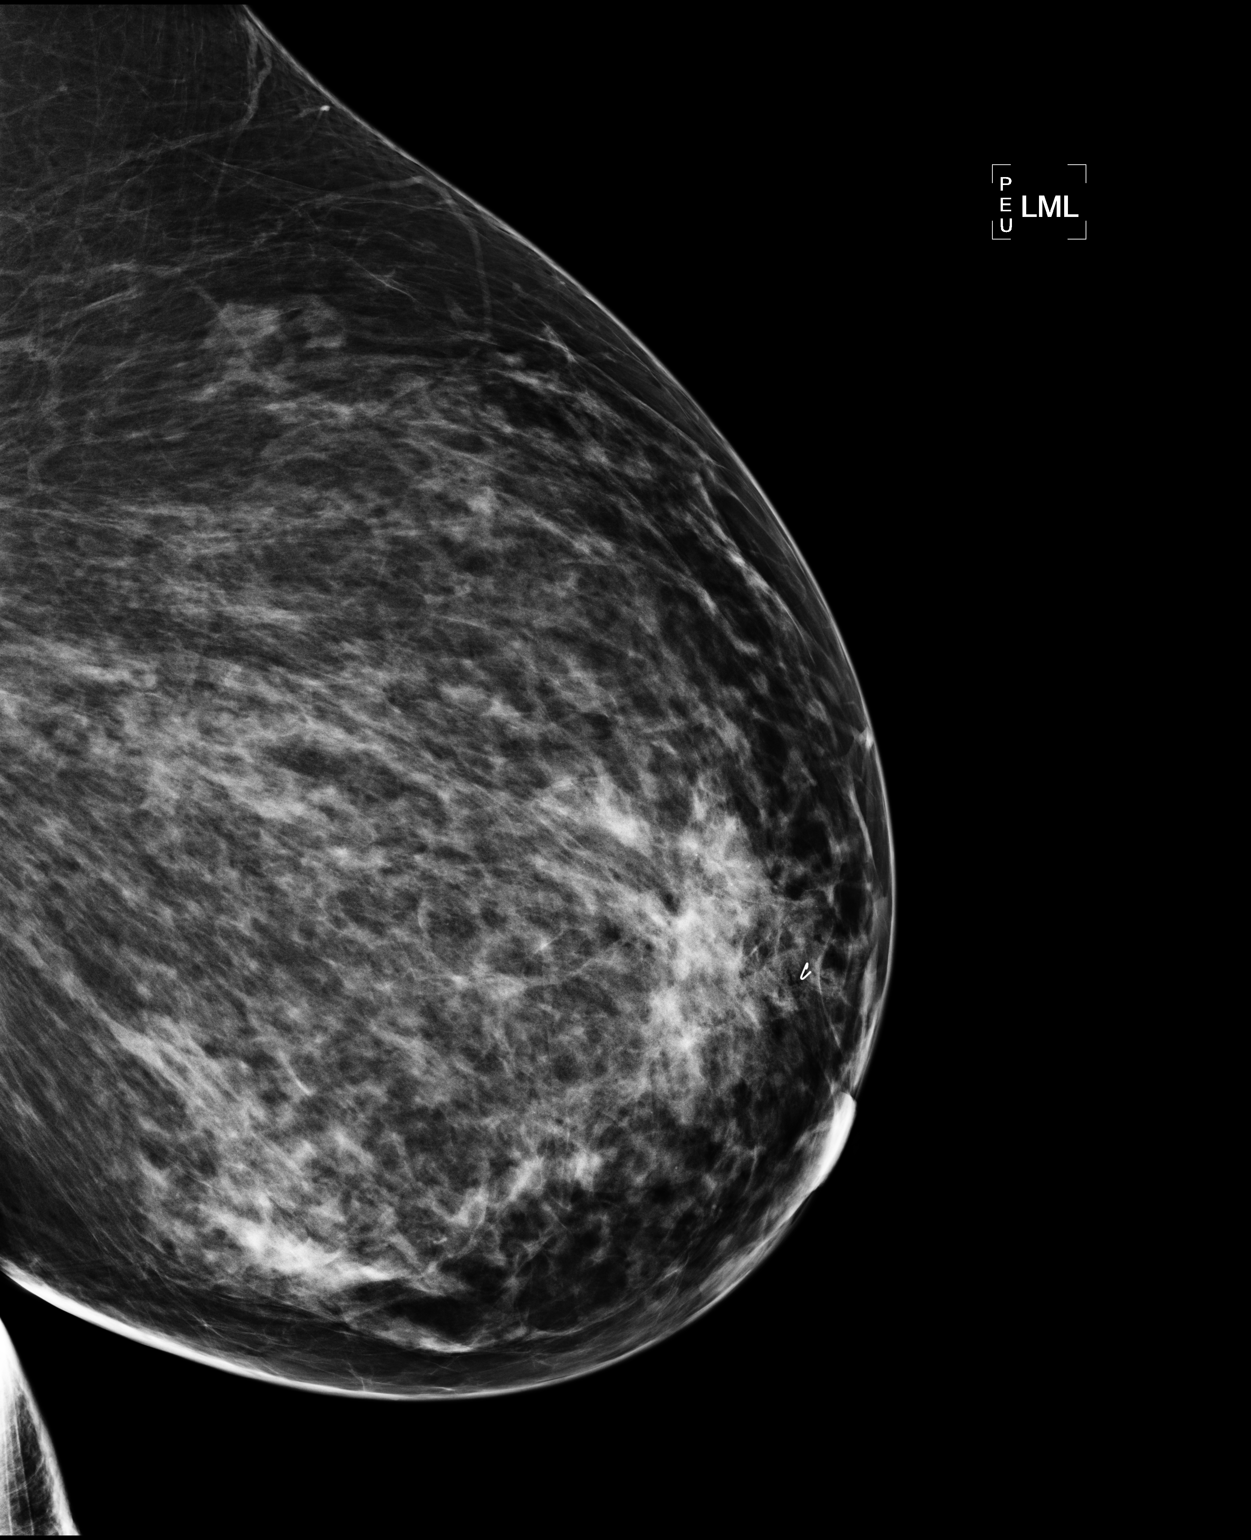

[2 of 2 positions shown; findings below may reference images not displayed]

FINDINGS: Films are performed following ultrasound guided biopsy
of mass in the 1 o'clock location of the left breast.  A wing-
shaped clip is identified in the upper-outer quadrant of the left
breast near the nipple.
IMPRESSION: Tissue marker clip is in expected location after biopsy.

## 2011-12-08 IMAGING — US CORE BIOPSY
1 series · 14 of 17 positions shown · non-contrast
Comparison: [DATE]

CLINICAL DATA: Status post ultrasound guided core biopsy of a mass
in the 1 o'clock location of the left breast.

DIGITAL DIAGNOSTIC LEFT MAMMOGRAM

[Series 1: core biopsy · 14 of 17 slices shown]
[im 1/17]
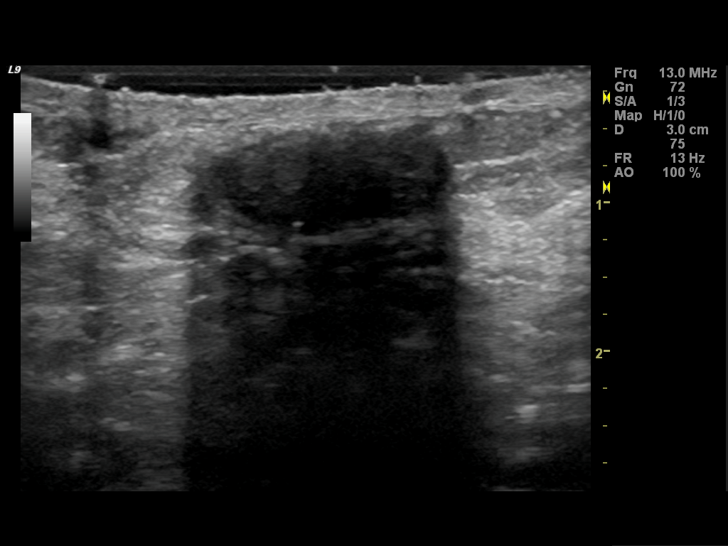
[im 2/17]
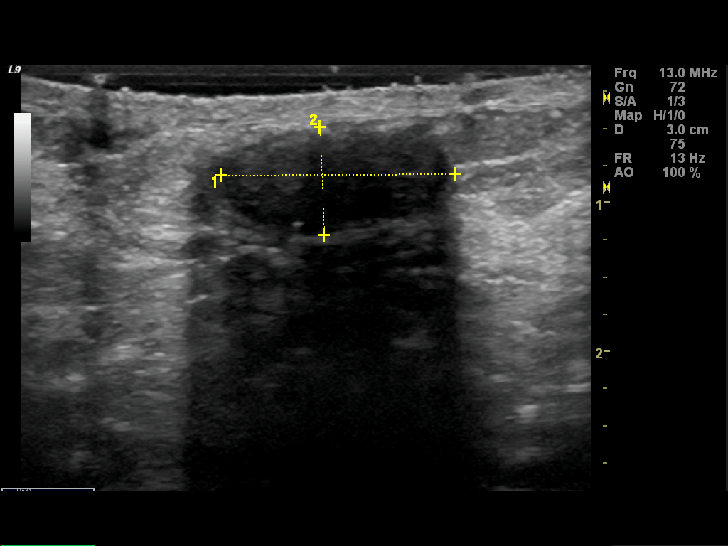
[im 4/17]
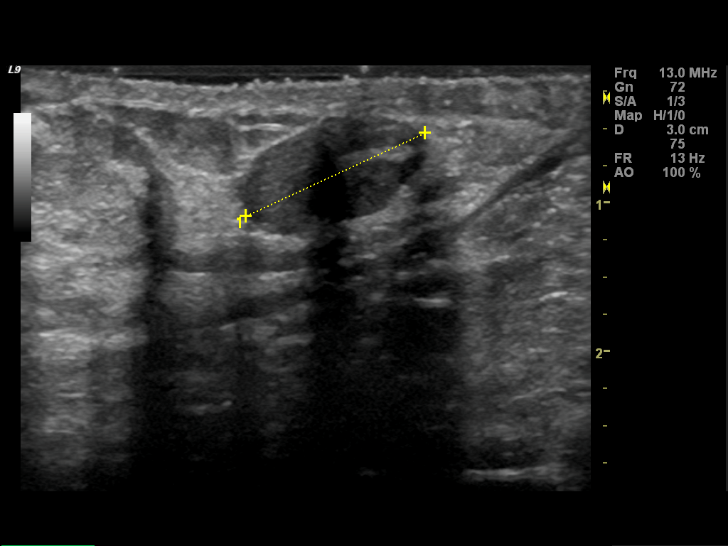
[im 5/17]
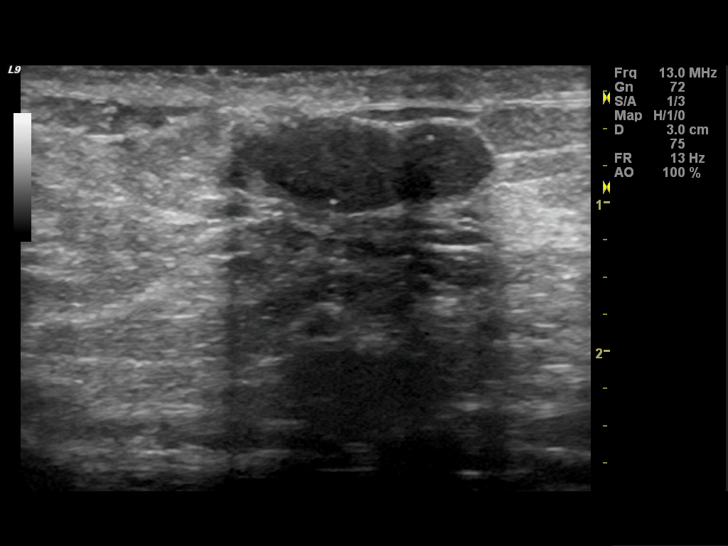
[im 6/17]
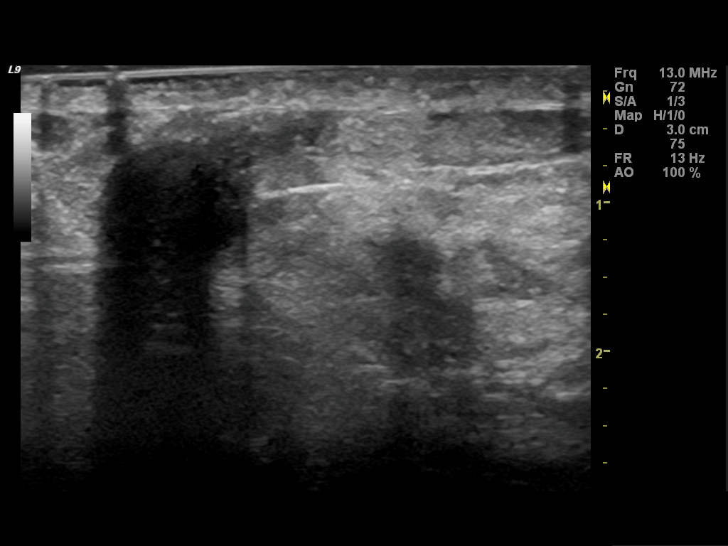
[im 7/17]
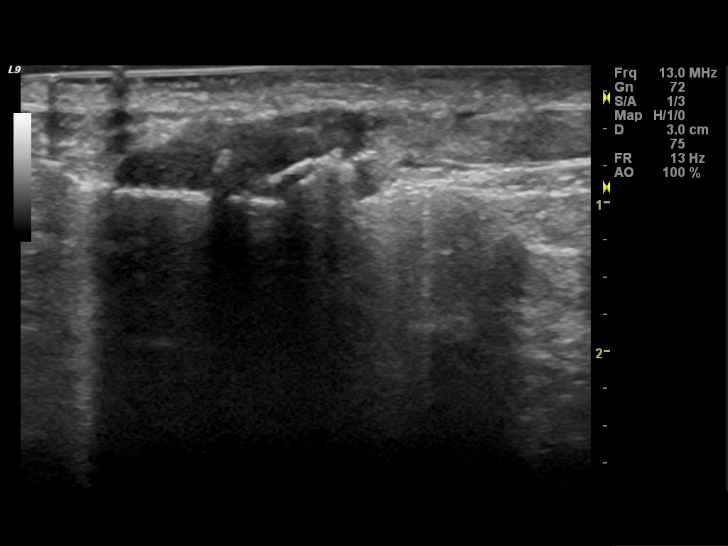
[im 8/17]
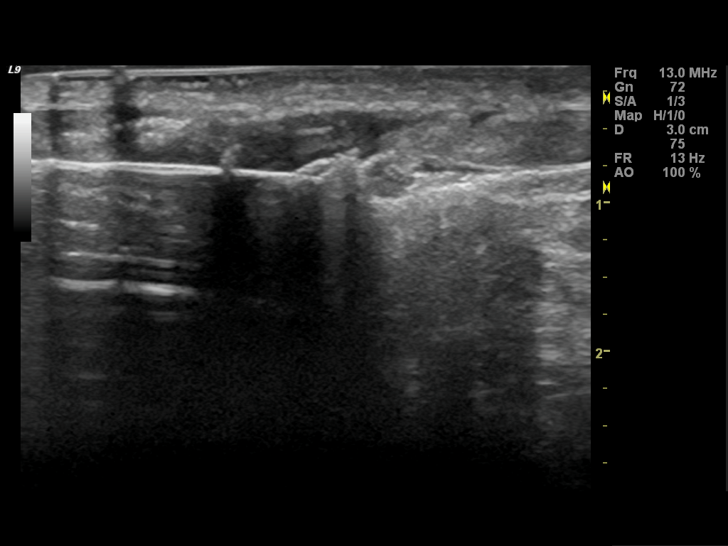
[im 10/17]
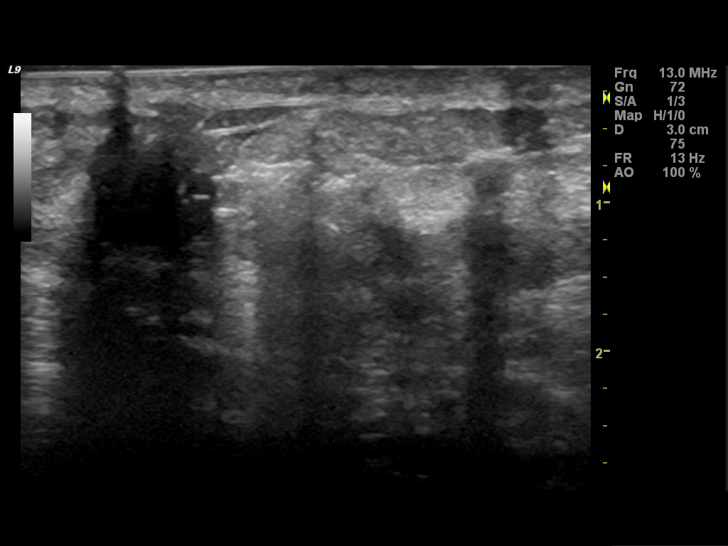
[im 11/17]
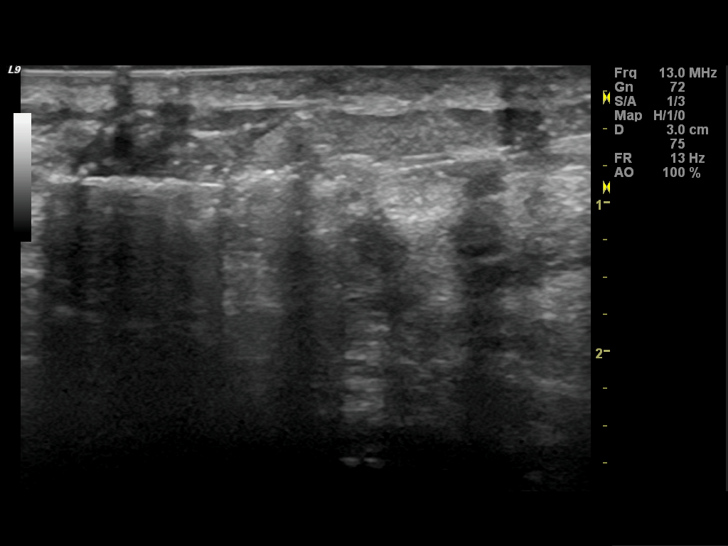
[im 12/17]
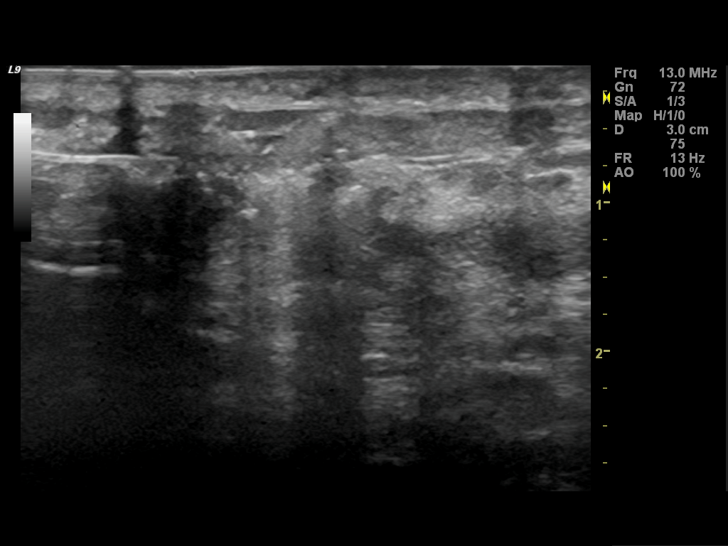
[im 13/17]
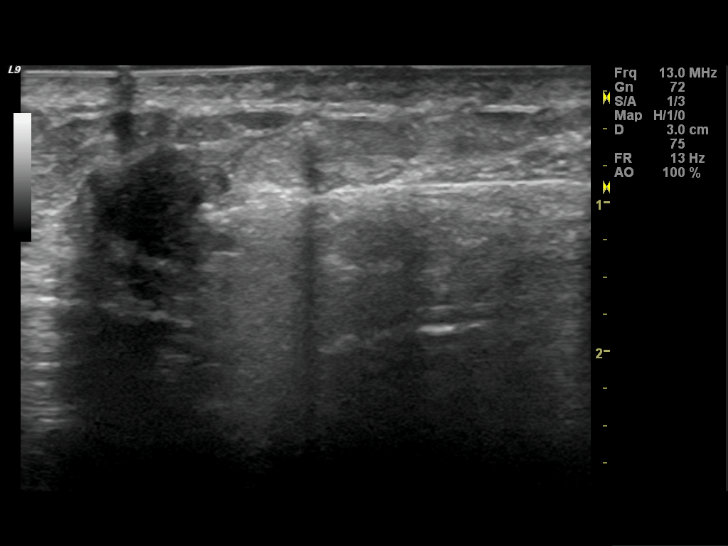
[im 14/17]
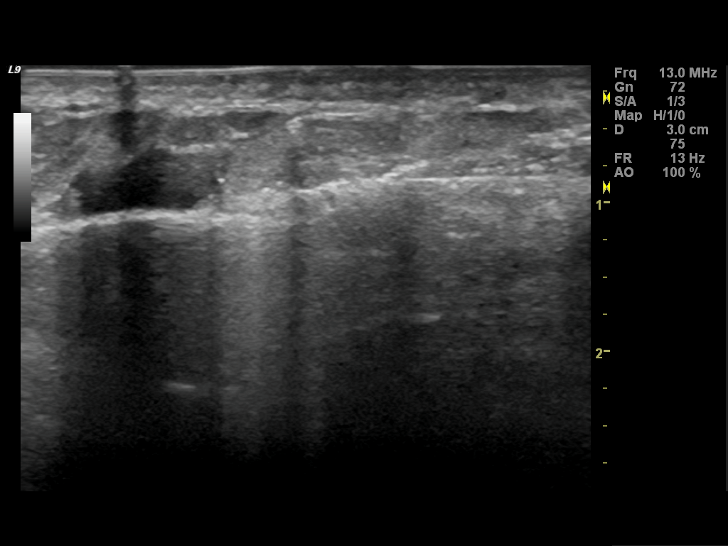
[im 16/17]
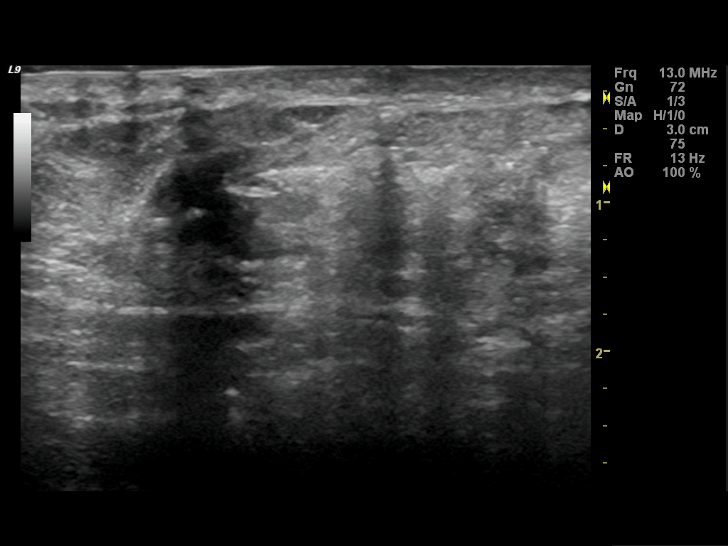
[im 17/17]
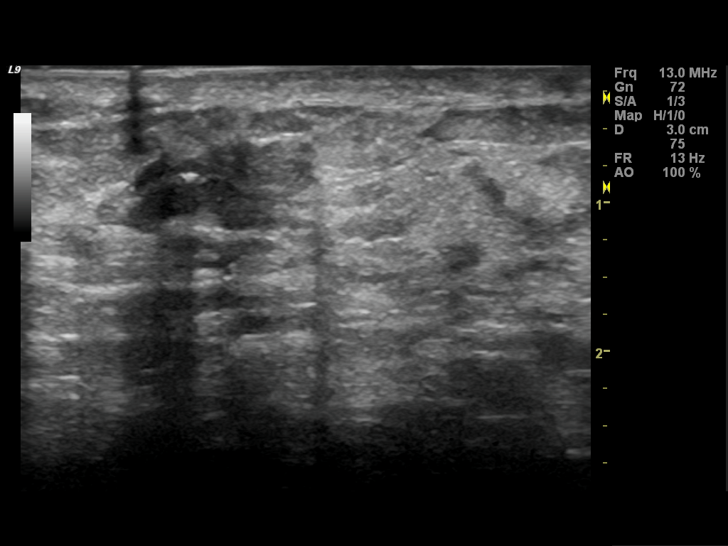

[14 of 17 positions shown; findings below may reference images not displayed]

FINDINGS: Films are performed following ultrasound guided biopsy
of mass in the 1 o'clock location of the left breast.  A wing-
shaped clip is identified in the upper-outer quadrant of the left
breast near the nipple.
IMPRESSION: Tissue marker clip is in expected location after biopsy.

## 2013-08-21 ENCOUNTER — Emergency Department (HOSPITAL_COMMUNITY)
Admission: EM | Admit: 2013-08-21 | Discharge: 2013-08-21 | Disposition: A | Discharge status: Home / Self Care | Payer: BC Managed Care – PPO | Attending: Emergency Medicine | Admitting: Emergency Medicine

## 2013-08-21 ENCOUNTER — Encounter (HOSPITAL_COMMUNITY): Payer: Self-pay | Admitting: Emergency Medicine

## 2013-08-21 DIAGNOSIS — W292XXA Contact with other powered household machinery, initial encounter: Secondary | ICD-10-CM | POA: Insufficient documentation

## 2013-08-21 DIAGNOSIS — Y929 Unspecified place or not applicable: Secondary | ICD-10-CM | POA: Insufficient documentation

## 2013-08-21 DIAGNOSIS — S61219A Laceration without foreign body of unspecified finger without damage to nail, initial encounter: Secondary | ICD-10-CM

## 2013-08-21 DIAGNOSIS — Z880 Allergy status to penicillin: Secondary | ICD-10-CM | POA: Insufficient documentation

## 2013-08-21 DIAGNOSIS — Z79899 Other long term (current) drug therapy: Secondary | ICD-10-CM | POA: Insufficient documentation

## 2013-08-21 DIAGNOSIS — Y9389 Activity, other specified: Secondary | ICD-10-CM | POA: Insufficient documentation

## 2013-08-21 DIAGNOSIS — S61209A Unspecified open wound of unspecified finger without damage to nail, initial encounter: Secondary | ICD-10-CM | POA: Insufficient documentation

## 2013-08-21 DIAGNOSIS — Z23 Encounter for immunization: Secondary | ICD-10-CM | POA: Insufficient documentation

## 2013-08-21 MED ORDER — LIDOCAINE HCL 2 % IJ SOLN
5.0000 mL | Freq: Once | INTRAMUSCULAR | Status: DC
  Filled 2013-08-21: qty 20

## 2013-08-21 MED ORDER — TETANUS-DIPHTH-ACELL PERTUSSIS 5-2.5-18.5 LF-MCG/0.5 IM SUSP
0.5000 mL | Freq: Once | INTRAMUSCULAR | Status: AC
  Administered 2013-08-21: 0.5 mL via INTRAMUSCULAR
  Filled 2013-08-21: qty 0.5

## 2013-08-21 NOTE — ED Provider Notes (Signed)
CSN: [id number]     Arrival date & time 08/21/13  2220 History   First MD Initiated Contact with Patient 08/21/13 2224    This chart was scribed for Antony Madura PA-C, a non-physician practitioner working with Ethelda Chick, MD by Lewanda Rife, ED Scribe. This patient was seen in room WTR6/WTR6 and the patient's care was started at 11:09 PM     Chief Complaint  Patient presents with  . Extremity Laceration   (Consider location/radiation/quality/duration/timing/severity/associated sxs/prior Treatment) The history is provided by the patient. No language interpreter was used.   HPI Comments: Tiffany Mullins is a 47 y.o. female who presents to the Emergency Department complaining of laceration of left distal index finger on the radial aspect of the nail bed onset 2130 while cutting collard greens. Reports bleeding was controlled with pressure dressing. Reports constant moderate pain to side. Denies any aggravating or alleviating factors. Denies other associated injuries, numbness, and weakness. Reports she does not take blood thinners. Reports tetanus status is not up to date.   History reviewed. No pertinent past medical history. History reviewed. No pertinent past surgical history. History reviewed. No pertinent family history. History  Substance Use Topics  . Smoking status: Never Smoker   . Smokeless tobacco: Never Used  . Alcohol Use: 0.6 oz/week    1 Glasses of wine per week     Comment: rarely   OB History   Grav Para Term Preterm Abortions TAB SAB Ect Mult Living                 Review of Systems  Skin: Positive for wound.  All other systems reviewed and are negative.  A complete 10 system review of systems was obtained and all systems are negative except as noted in the HPI and PMHx.     Allergies  Penicillins  Home Medications   Current Outpatient Rx  Name  Route  Sig  Dispense  Refill  . ergocalciferol (VITAMIN D2) 50000 UNITS capsule   Oral   Take  50,000 Units by mouth once a week. Takes on Friday         . hydrochlorothiazide (MICROZIDE) 12.5 MG capsule   Oral   Take 12.5 mg by mouth daily.         . hydroxypropyl methylcellulose (ISOPTO TEARS) 2.5 % ophthalmic solution   Both Eyes   Place 2 drops into both eyes daily as needed for dry eyes.         Marland Kitchen ibuprofen (ADVIL,MOTRIN) 200 MG tablet   Oral   Take 200-800 mg by mouth every 8 (eight) hours as needed for headache or moderate pain.         . Prenatal Vit-Fe Fumarate-FA (MULTIVITAMIN-PRENATAL) 27-0.8 MG TABS tablet   Oral   Take 1 tablet by mouth daily.          BP 143/82  Pulse 80  Temp(Src) 97.5 F (36.4 C) (Oral)  Resp 16  SpO2 99%  LMP 08/18/2013  Physical Exam  Nursing note and vitals reviewed. Constitutional: She is oriented to person, place, and time. She appears well-developed and well-nourished. No distress.  HENT:  Head: Normocephalic and atraumatic.  Eyes: Conjunctivae and EOM are normal.  Neck: Neck supple. No tracheal deviation present.  Cardiovascular: Normal rate, regular rhythm and intact distal pulses.   Pulses:      Radial pulses are 2+ on the right side, and 2+ on the left side.  Cap refill  < 2 seconds.  Pulmonary/Chest: Effort normal. No respiratory distress.  Musculoskeletal: Normal range of motion.  TTP of distal tip of L index finger.  Neurological: She is alert and oriented to person, place, and time.  5/5 strength against resistance of left index FDP, FDS and extensor. Sensation is intact in LUE and hand.  Skin: Skin is warm and dry. No rash noted. No erythema. No pallor.  0.5 cm laceration of left distal index finger on the radial aspect of the nail bed; Nail bed intact without signs of subungual hematoma.  Psychiatric: She has a normal mood and affect. Her behavior is normal.    ED Course  Procedures (including critical care time)  COORDINATION OF CARE:  Nursing notes reviewed. Vital signs reviewed. Initial pt  interview and examination performed.   LACERATION REPAIR Performed by: Antony Madura PA-C  Consent: Verbal consent obtained. Risks and benefits: risks, benefits and alternatives were discussed Patient identity confirmed: provided demographic data Time out performed prior to procedure Prepped and Draped in normal sterile fashion Wound explored Laceration Location: left distal index finger on the radial aspect of the nailbed Laceration Length: 0.5 cm No Foreign Bodies seen or palpated Anesthesia: none Local anesthetic: none, but pt offered option and declined Anesthetic total: n/a  Irrigation method: syringe Amount of cleaning: standard Skin closure: 6-0 Chromic gut  Number of sutures or staples: 1 Technique: simple interrupted  Patient tolerance: Patient tolerated the procedure well with no immediate complications.  Treatment plan initiated: Medications  Tdap (BOOSTRIX) injection 0.5 mL (0.5 mLs Intramuscular Given 08/21/13 2258)   Initial diagnostic testing ordered.    Labs Review Labs Reviewed - No data to display Imaging Review No results found.  EKG Interpretation   None      MDM   1. Laceration of finger, initial encounter    47 year old female presents for a laceration to the medial aspect of the distal tip of her left index finger. Injury sustained while slicing collard greens. Patient neurovascularly intact and bleeding controlled. Patient denies the use of blood thinners. Tetanus status unknown; updated in ED. Laceration closed with 6-0 chromic suture x 1 without complication; local anesthesia offered to patient prior which she declined. Patient stable for d/c with laceration care instructions. Recommended ibuprofen for pain control. Return precautions discussed and patient agreeable to plan with no unaddressed concerns.  I personally performed the services described in this documentation, which was scribed in my presence. The recorded information has been reviewed  and is accurate.     Antony Madura, PA-C 08/21/13 2315

## 2013-08-21 NOTE — ED Notes (Signed)
Pt reports cutting her L hand first finger with a knife at 2130. Pt reports she wrapped it in a washcloth and bleeding has been controlled with this. Pt denies taking blood thinners, unable to keep bleeding controlled without being wrapped, states she does not want stitches.

## 2013-08-23 NOTE — ED Provider Notes (Signed)
Medical screening examination/treatment/procedure(s) were performed by non-physician practitioner and as supervising physician I was immediately available for consultation/collaboration.  EKG Interpretation   None        Martha K Linker, MD 08/23/13 1505 

## 2014-01-24 ENCOUNTER — Other Ambulatory Visit: Payer: Self-pay

## 2014-01-24 DIAGNOSIS — Z1231 Encounter for screening mammogram for malignant neoplasm of breast: Secondary | ICD-10-CM

## 2014-02-13 ENCOUNTER — Ambulatory Visit: Payer: BC Managed Care – PPO

## 2014-02-21 ENCOUNTER — Ambulatory Visit
Admission: RE | Admit: 2014-02-21 | Discharge: 2014-02-21 | Disposition: A | Discharge status: Home / Self Care | Payer: BC Managed Care – PPO | Source: Ambulatory Visit

## 2014-02-21 DIAGNOSIS — Z1231 Encounter for screening mammogram for malignant neoplasm of breast: Secondary | ICD-10-CM

## 2014-02-24 ENCOUNTER — Other Ambulatory Visit: Payer: Self-pay | Admitting: Obstetrics and Gynecology

## 2014-02-24 DIAGNOSIS — R928 Other abnormal and inconclusive findings on diagnostic imaging of breast: Secondary | ICD-10-CM

## 2014-03-06 ENCOUNTER — Ambulatory Visit
Admission: RE | Admit: 2014-03-06 | Discharge: 2014-03-06 | Disposition: A | Discharge status: Home / Self Care | Payer: BC Managed Care – PPO | Source: Ambulatory Visit | Attending: Obstetrics and Gynecology | Admitting: Obstetrics and Gynecology

## 2014-03-06 DIAGNOSIS — R928 Other abnormal and inconclusive findings on diagnostic imaging of breast: Secondary | ICD-10-CM

## 2014-05-14 ENCOUNTER — Ambulatory Visit (HOSPITAL_COMMUNITY)
Admission: RE | Admit: 2014-05-14 | Discharge: 2014-05-14 | Disposition: A | Discharge status: Home / Self Care | Payer: BC Managed Care – PPO | Source: Ambulatory Visit | Attending: Cardiology | Admitting: Cardiology

## 2014-05-14 ENCOUNTER — Other Ambulatory Visit (HOSPITAL_COMMUNITY): Payer: Self-pay | Admitting: Sports Medicine

## 2014-05-14 DIAGNOSIS — R609 Edema, unspecified: Secondary | ICD-10-CM | POA: Insufficient documentation

## 2014-05-14 NOTE — Progress Notes (Signed)
Left Lower Extremity Venous Duplex Completed. No evidence for DVT or SVT. °Brianna L Mazza,RVT °

## 2014-05-19 ENCOUNTER — Telehealth (HOSPITAL_COMMUNITY): Payer: Self-pay | Admitting: *Deleted

## 2014-07-17 DIAGNOSIS — I872 Venous insufficiency (chronic) (peripheral): Secondary | ICD-10-CM | POA: Insufficient documentation

## 2014-08-18 ENCOUNTER — Other Ambulatory Visit: Payer: Self-pay | Admitting: Obstetrics and Gynecology

## 2014-08-18 DIAGNOSIS — R921 Mammographic calcification found on diagnostic imaging of breast: Secondary | ICD-10-CM

## 2014-09-04 ENCOUNTER — Ambulatory Visit
Admission: RE | Admit: 2014-09-04 | Discharge: 2014-09-04 | Disposition: A | Discharge status: Home / Self Care | Payer: BC Managed Care – PPO | Source: Ambulatory Visit | Attending: Obstetrics and Gynecology | Admitting: Obstetrics and Gynecology

## 2014-09-04 DIAGNOSIS — R921 Mammographic calcification found on diagnostic imaging of breast: Secondary | ICD-10-CM

## 2015-03-26 ENCOUNTER — Other Ambulatory Visit: Payer: Self-pay | Admitting: Obstetrics and Gynecology

## 2015-03-26 ENCOUNTER — Other Ambulatory Visit: Payer: Self-pay

## 2015-03-26 DIAGNOSIS — R922 Inconclusive mammogram: Secondary | ICD-10-CM

## 2015-04-02 ENCOUNTER — Ambulatory Visit
Admission: RE | Admit: 2015-04-02 | Discharge: 2015-04-02 | Disposition: A | Discharge status: Home / Self Care | Payer: BC Managed Care – PPO | Source: Ambulatory Visit | Attending: Obstetrics and Gynecology | Admitting: Obstetrics and Gynecology

## 2015-04-02 DIAGNOSIS — R922 Inconclusive mammogram: Secondary | ICD-10-CM

## 2015-04-02 IMAGING — MG MM DIAG BREAST TOMO BILATERAL
6 of 10 series · 6 of 26 positions shown · non-contrast
Comparison: Prior mammograms.

CLINICAL DATA: Follow-up of probably benign left breast
calcifications. Prior benign left breast core needle biopsy.

EXAM:
DIGITAL DIAGNOSTIC BILATERAL MAMMOGRAM WITH 3D TOMOSYNTHESIS AND CAD

[L CC (1 of 2)]
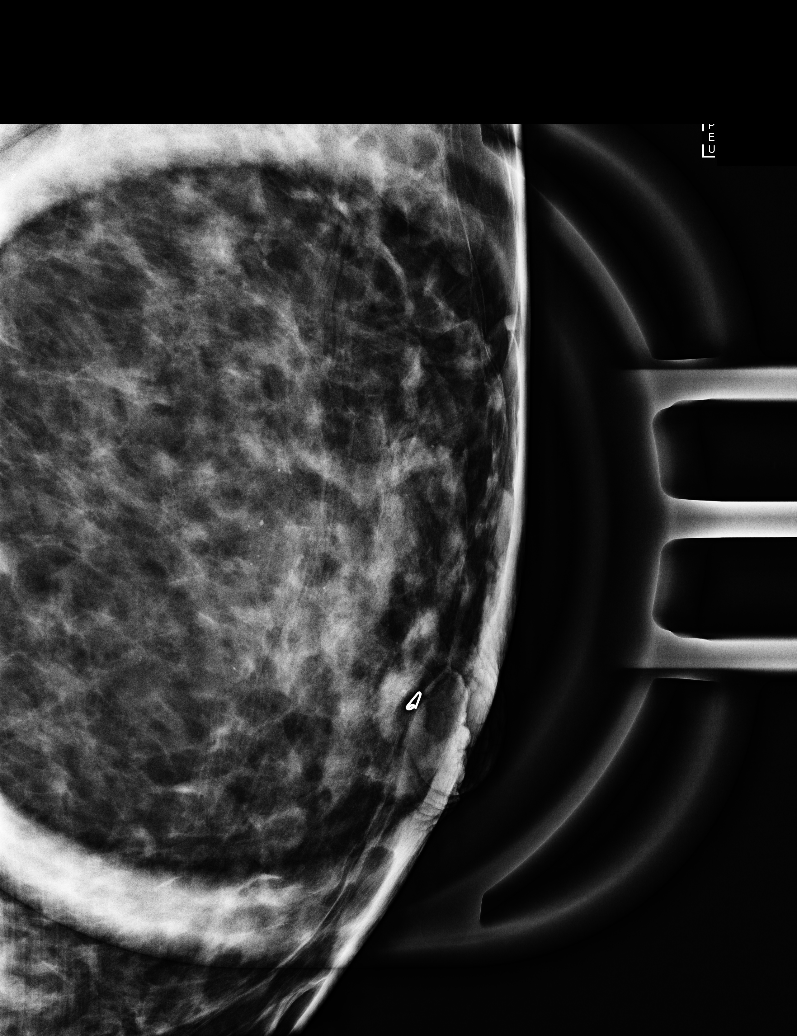

[L ML]
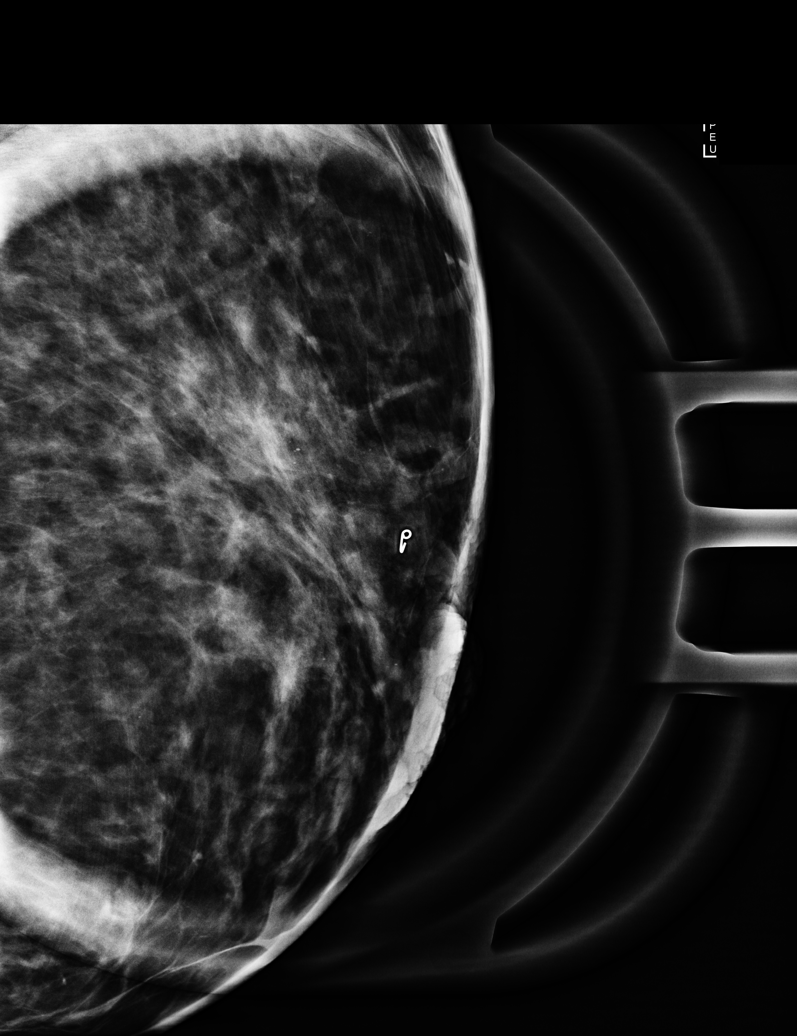

[L MLO]
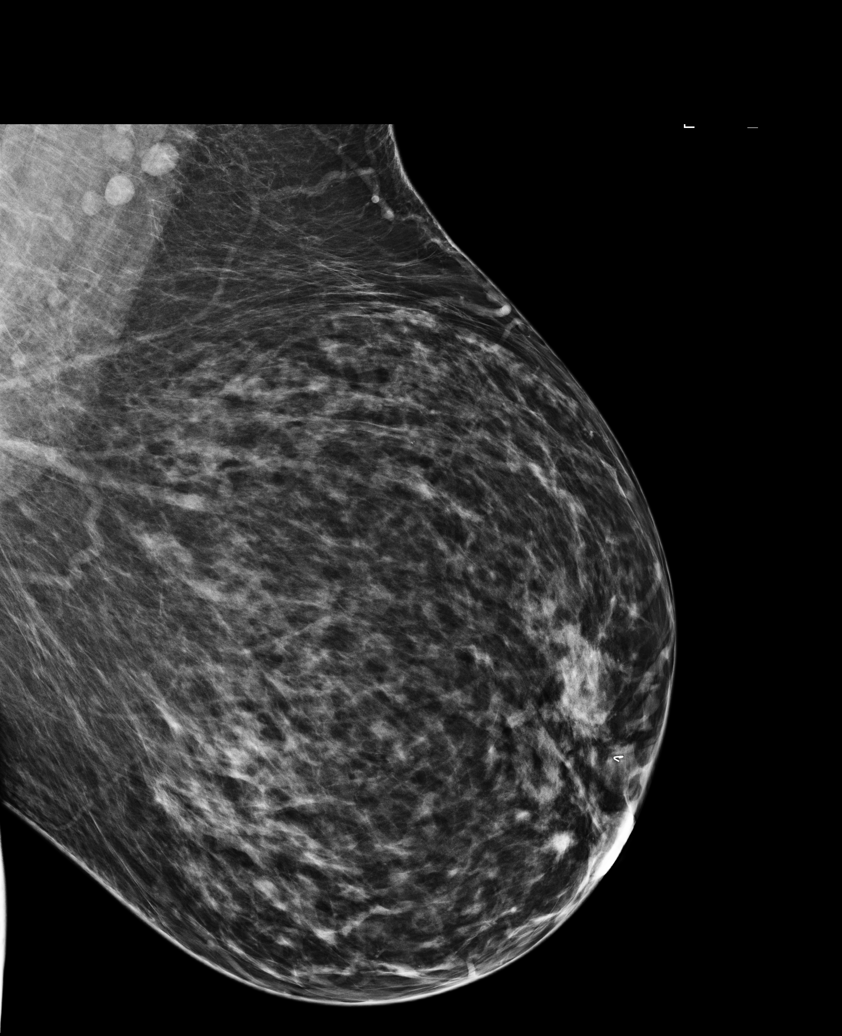

[R CC]
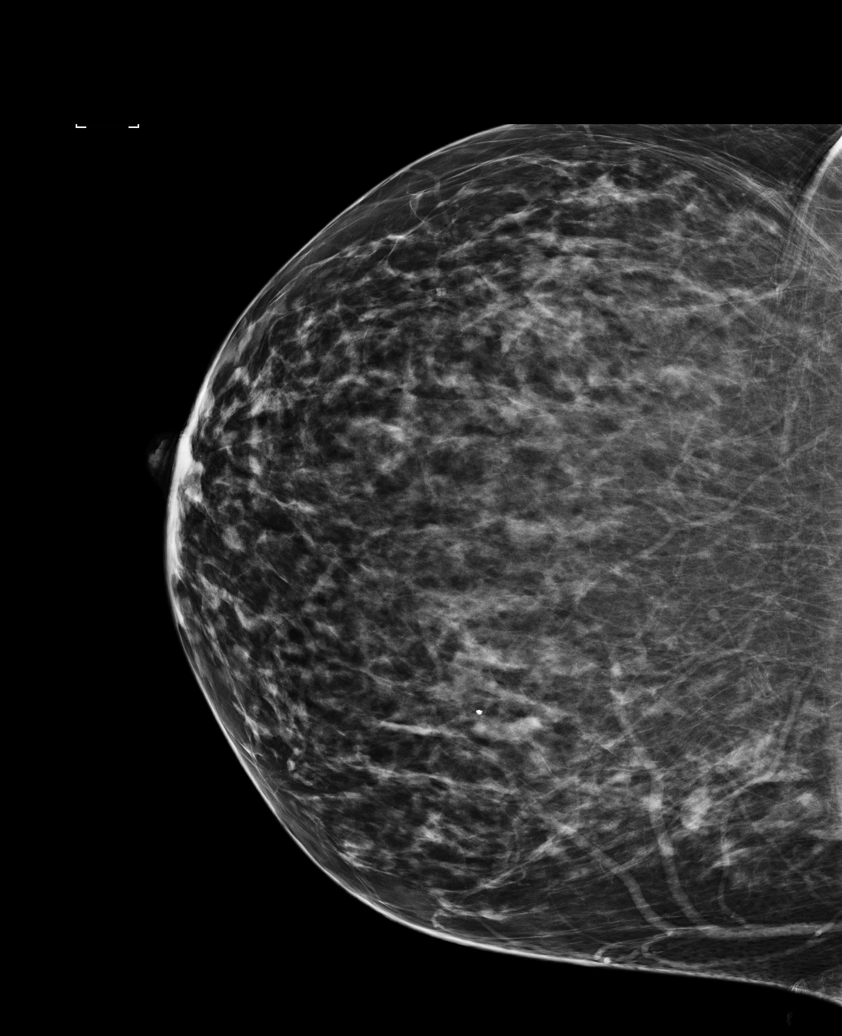

[L CC (2 of 2)]
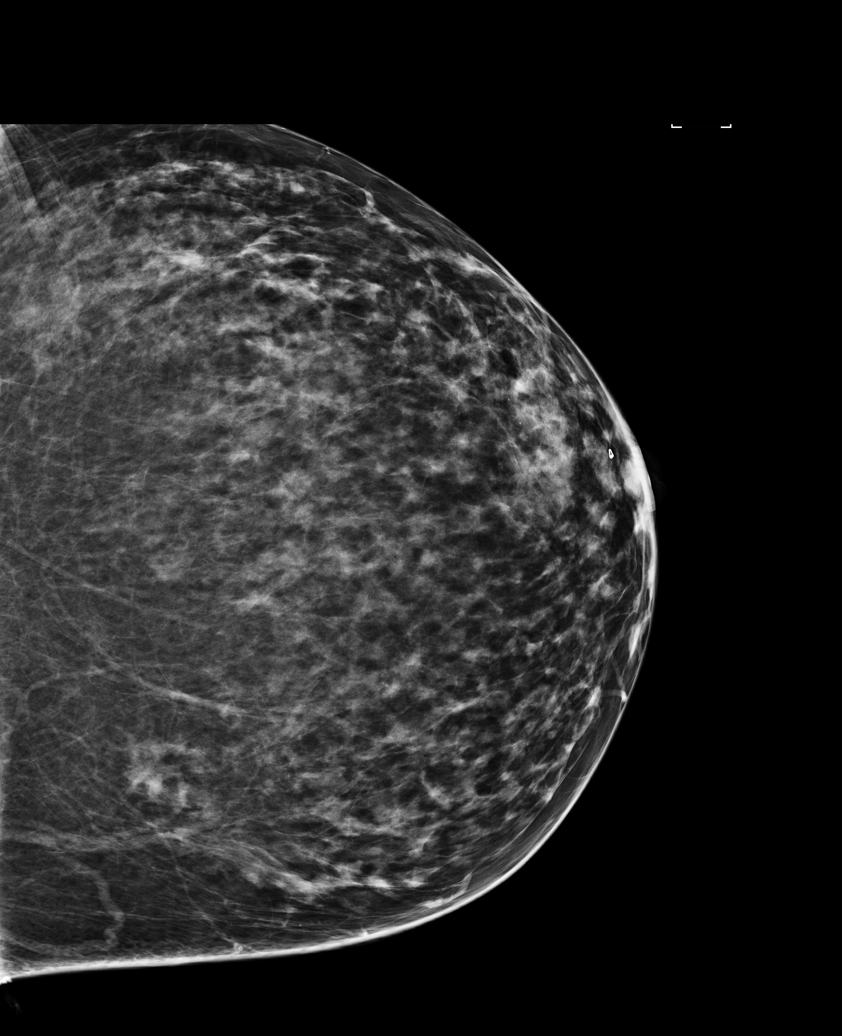

[R MLO]
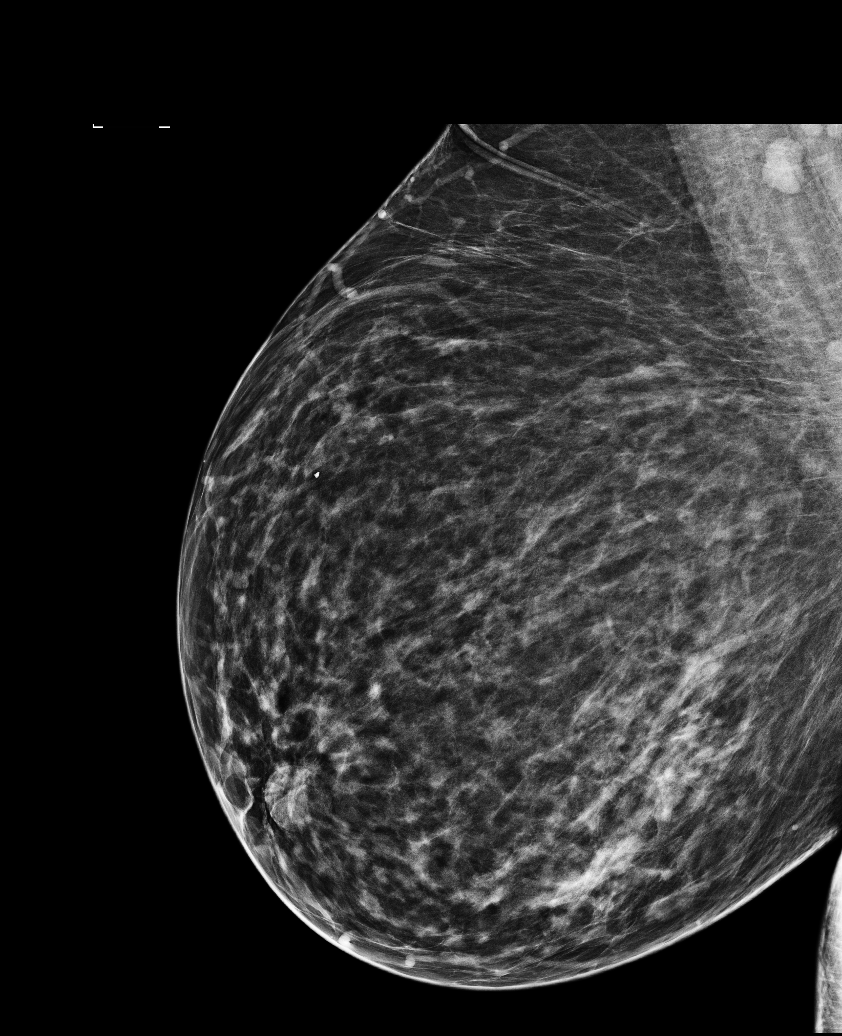

[6 of 26 positions shown; findings below may reference images not displayed]

ACR Breast Density Category b: There are scattered areas of
fibroglandular density.
FINDINGS: There are no suspicious masses, areas of architectural distortion or
microcalcifications bilaterally. Previously noted calcifications in
the left breast upper outer quadrant, anterior depth, continued to
demonstrate benign morphology on compression magnification views.

Mammographic images were processed with CAD.
IMPRESSION: No mammographic evidence of malignancy in either breast.

RECOMMENDATION:
Screening mammogram in one year.(Code:[6Q])

Patient left before were available. Results will be provided in
writing at the conclusion of the visit. If applicable, a reminder
letter will be sent to the patient regarding the next appointment.

BI-RADS CATEGORY  2: Benign.

## 2016-04-13 ENCOUNTER — Other Ambulatory Visit: Payer: Self-pay | Admitting: Obstetrics and Gynecology

## 2016-04-13 DIAGNOSIS — Z1231 Encounter for screening mammogram for malignant neoplasm of breast: Secondary | ICD-10-CM

## 2016-04-15 ENCOUNTER — Ambulatory Visit
Admission: RE | Admit: 2016-04-15 | Discharge: 2016-04-15 | Disposition: A | Discharge status: Home / Self Care | Payer: BC Managed Care – PPO | Source: Ambulatory Visit | Attending: Obstetrics and Gynecology | Admitting: Obstetrics and Gynecology

## 2016-04-15 DIAGNOSIS — Z1231 Encounter for screening mammogram for malignant neoplasm of breast: Secondary | ICD-10-CM

## 2016-12-29 DIAGNOSIS — I83812 Varicose veins of left lower extremities with pain: Secondary | ICD-10-CM | POA: Insufficient documentation

## 2016-12-29 DIAGNOSIS — R600 Localized edema: Secondary | ICD-10-CM | POA: Insufficient documentation

## 2016-12-29 DIAGNOSIS — I83819 Varicose veins of unspecified lower extremities with pain: Secondary | ICD-10-CM | POA: Insufficient documentation

## 2017-05-05 ENCOUNTER — Other Ambulatory Visit: Payer: Self-pay | Admitting: Obstetrics and Gynecology

## 2017-05-05 DIAGNOSIS — Z1231 Encounter for screening mammogram for malignant neoplasm of breast: Secondary | ICD-10-CM

## 2017-05-16 ENCOUNTER — Ambulatory Visit
Admission: RE | Admit: 2017-05-16 | Discharge: 2017-05-16 | Disposition: A | Discharge status: Home / Self Care | Payer: BC Managed Care – PPO | Source: Ambulatory Visit | Attending: Obstetrics and Gynecology | Admitting: Obstetrics and Gynecology

## 2017-05-16 DIAGNOSIS — Z1231 Encounter for screening mammogram for malignant neoplasm of breast: Secondary | ICD-10-CM

## 2017-12-07 ENCOUNTER — Encounter (INDEPENDENT_AMBULATORY_CARE_PROVIDER_SITE_OTHER): Payer: Self-pay | Admitting: Orthopedic Surgery

## 2017-12-07 ENCOUNTER — Ambulatory Visit (INDEPENDENT_AMBULATORY_CARE_PROVIDER_SITE_OTHER): Payer: BC Managed Care – PPO | Admitting: Orthopedic Surgery

## 2017-12-07 ENCOUNTER — Ambulatory Visit (INDEPENDENT_AMBULATORY_CARE_PROVIDER_SITE_OTHER): Payer: BC Managed Care – PPO

## 2017-12-07 VITALS — Ht 66.0 in | Wt 252.0 lb

## 2017-12-07 DIAGNOSIS — G8929 Other chronic pain: Secondary | ICD-10-CM | POA: Diagnosis not present

## 2017-12-07 DIAGNOSIS — M25562 Pain in left knee: Secondary | ICD-10-CM | POA: Diagnosis not present

## 2017-12-07 DIAGNOSIS — M1712 Unilateral primary osteoarthritis, left knee: Secondary | ICD-10-CM | POA: Diagnosis not present

## 2017-12-07 MED ORDER — METHYLPREDNISOLONE ACETATE 40 MG/ML IJ SUSP
40.0000 mg | INTRAMUSCULAR | Status: AC | PRN
  Administered 2017-12-07: 40 mg via INTRA_ARTICULAR

## 2017-12-07 MED ORDER — LIDOCAINE HCL 1 % IJ SOLN
5.0000 mL | INTRAMUSCULAR | Status: AC | PRN
  Administered 2017-12-07: 5 mL

## 2017-12-07 NOTE — Progress Notes (Signed)
Office Visit Note   Patient: Tiffany Mullins           Date of Birth: Apr 23, 1966           MRN: [id number] Visit Date: 12/07/2017              Requested by: Darrow Bussing, MD [address] [place], [place] [address] PCP: Darrow Bussing, MD  Chief Complaint  Patient presents with  . Left Knee - Pain      HPI: Patient is a 52 year old woman with mechanical symptoms of her left knee she complains of catching locking popping giving way.  Assessment & Plan: Visit Diagnoses:  1. Chronic pain of left knee   2. Unilateral primary osteoarthritis, left knee     Plan: The left knee was injected she tolerated this well plan follow-up in 4 weeks.  If she gets temporary relief with the steroid injection we could consider hyaluronic acid injection.  Discussed that if she fails conservative treatment a total knee arthroplasty is an option.  Follow-Up Instructions: Return in about 4 weeks (around 01/04/2018).   Ortho Exam  Patient is alert, oriented, no adenopathy, well-dressed, normal affect, normal respiratory effort. Examination patient has an antalgic gait there is crepitation with range of motion of her knee there is tenderness to palpation the medial lateral joint line as well as the patellofemoral joint.  Collaterals and cruciates are stable.  Imaging: Xr Knee 1-2 Views Left  Result Date: 12/07/2017 2 view radiographs of the left knee shows tricompartmental osteoarthritis large osteophytic bone spurs in all 3 compartments.  Patient has no significant varus or valgus deformity.  There is subcondylar sclerosis.  No periarticular cysts.  No images are attached to the encounter.  Labs: No results found for: HGBA1C, ESRSEDRATE, CRP, LABURIC, REPTSTATUS, GRAMSTAIN, CULT, LABORGA  @LABSALLVALUES (HGBA1)@  Body mass index is 40.67 kg/m.  Orders:  Orders Placed This Encounter  Procedures  . XR Knee 1-2 Views Left   No orders of the defined types were placed in  this encounter.    Procedures: Large Joint Inj: L knee on 12/07/2017 11:14 AM Indications: pain and diagnostic evaluation Details: 22 G 1.5 in needle, anteromedial approach  Arthrogram: No  Medications: 5 mL lidocaine 1 %; 40 mg methylPREDNISolone acetate 40 MG/ML Outcome: tolerated well, no immediate complications Procedure, treatment alternatives, risks and benefits explained, specific risks discussed. Consent was given by the patient. Immediately prior to procedure a time out was called to verify the correct patient, procedure, equipment, support staff and site/side marked as required. Patient was prepped and draped in the usual sterile fashion.      Clinical Data: No additional findings.  ROS:  All other systems negative, except as noted in the HPI. Review of Systems  Objective: Vital Signs: Ht 5\' 6"  (1.676 m)   Wt 252 lb (114.3 kg)   BMI 40.67 kg/m   Specialty Comments:  No specialty comments available.  PMFS History: There are no active problems to display for this patient.  No past medical history on file.  Family History  Problem Relation Age of Onset  . Breast cancer Mother     No past surgical history on file. Social History   Occupational History  . Not on file  Tobacco Use  . Smoking status: Never Smoker  . Smokeless tobacco: Never Used  Substance and Sexual Activity  . Alcohol use: Yes    Alcohol/week: 0.6 oz    Types: 1 Glasses of wine  per week    Comment: rarely  . Drug use: No  . Sexual activity: Yes

## 2018-01-04 ENCOUNTER — Encounter (INDEPENDENT_AMBULATORY_CARE_PROVIDER_SITE_OTHER): Payer: Self-pay | Admitting: Orthopedic Surgery

## 2018-01-04 ENCOUNTER — Ambulatory Visit (INDEPENDENT_AMBULATORY_CARE_PROVIDER_SITE_OTHER): Payer: BC Managed Care – PPO | Admitting: Orthopedic Surgery

## 2018-01-04 DIAGNOSIS — M1712 Unilateral primary osteoarthritis, left knee: Secondary | ICD-10-CM

## 2018-01-04 NOTE — Progress Notes (Signed)
   Office Visit Note   Patient: Tiffany Mullins           Date of Birth: August 25, 1966           MRN: [id number] Visit Date: 01/04/2018              Requested by: Darrow BussingKoirala, [name], MD [address] [place], [place] [id number] PCP: Darrow BussingKoirala, [name], MD  Chief Complaint  Patient presents with  . Left Knee - Pain, Follow-up      HPI: Patient is a 52 year old woman who presents follow-up status post intra-articular injection for osteoarthritis left knee.  Patient states that she is feeling much better.  Assessment & Plan: Visit Diagnoses:  1. Unilateral primary osteoarthritis, left knee     Plan: Recommended exercise strengthening VMO strengthening discussed that if she becomes more symptomatic we could proceed with additional injections up to about 3 a year.  Follow-Up Instructions: Return if symptoms worsen or fail to improve.   Ortho Exam  Patient is alert, oriented, no adenopathy, well-dressed, normal affect, normal respiratory effort. Examination patient has a normal gait.  She does have a little bony spur on the lateral aspect of the right patella there is no effusion there is minimal crepitation range of motion the right knee the medial and lateral joint lines are nontender to palpation.  Semination of the left knee she has more crepitation left knee than the right knee there is no effusion medial lateral joint lines are nontender to palpation collaterals and cruciates are stable.  Imaging: No results found. No images are attached to the encounter.  Labs: No results found for: HGBA1C, ESRSEDRATE, CRP, LABURIC, REPTSTATUS, GRAMSTAIN, CULT, LABORGA  @LABSALLVALUES (HGBA1)@  There is no height or weight on file to calculate BMI.  Orders:  No orders of the defined types were placed in this encounter.  No orders of the defined types were placed in this encounter.    Procedures: No procedures performed  Clinical Data: No additional  findings.  ROS:  All other systems negative, except as noted in the HPI. Review of Systems  Objective: Vital Signs: There were no vitals taken for this visit.  Specialty Comments:  No specialty comments available.  PMFS History: There are no active problems to display for this patient.  History reviewed. No pertinent past medical history.  Family History  Problem Relation Age of Onset  . Breast cancer Mother     History reviewed. No pertinent surgical history. Social History   Occupational History  . Not on file  Tobacco Use  . Smoking status: Never Smoker  . Smokeless tobacco: Never Used  Substance and Sexual Activity  . Alcohol use: Yes    Alcohol/week: 0.6 oz    Types: 1 Glasses of wine per week    Comment: rarely  . Drug use: No  . Sexual activity: Yes

## 2018-04-27 ENCOUNTER — Other Ambulatory Visit: Payer: Self-pay | Admitting: Obstetrics and Gynecology

## 2018-04-27 ENCOUNTER — Other Ambulatory Visit: Payer: Self-pay | Admitting: Family Medicine

## 2018-04-27 DIAGNOSIS — Z1231 Encounter for screening mammogram for malignant neoplasm of breast: Secondary | ICD-10-CM

## 2018-05-22 ENCOUNTER — Encounter: Payer: Self-pay | Admitting: Podiatry

## 2018-05-22 ENCOUNTER — Ambulatory Visit: Payer: BC Managed Care – PPO | Admitting: Podiatry

## 2018-05-22 VITALS — BP 135/88 | HR 69

## 2018-05-22 DIAGNOSIS — L600 Ingrowing nail: Secondary | ICD-10-CM | POA: Diagnosis not present

## 2018-05-22 MED ORDER — NEOMYCIN-POLYMYXIN-HC 1 % OT SOLN: OTIC | 1 refills | Status: DC

## 2018-05-22 NOTE — Progress Notes (Signed)
Subjective:  Patient ID: Tiffany RasherShereen G Andringa, female    DOB: [birth year],  MRN: [id number] HPI Chief Complaint  Patient presents with  . Toe Pain    left 5th toe,right great toe   . Bunions    b/l great toes have knots since 1 month    52 y.o. female presents with the above complaint.   ROS: Denies fever chills nausea vomiting muscle aches pains calf pain back pain chest pain shortness of breath.  No past medical history on file. No past surgical history on file.  Current Outpatient Medications:  .  Cholecalciferol (VITAMIN D3) 1000 units CAPS, Take by mouth., Disp: , Rfl:  .  hydrochlorothiazide (HYDRODIURIL) 25 MG tablet, Take 25 mg by mouth daily., Disp: , Rfl: 0 .  vitamin E 400 UNIT capsule, Take 400 Units by mouth daily., Disp: , Rfl:  .  ergocalciferol (VITAMIN D2) 50000 UNITS capsule, Take 50,000 Units by mouth once a week. Takes on Friday, Disp: , Rfl:  .  hydroxypropyl methylcellulose (ISOPTO TEARS) 2.5 % ophthalmic solution, Place 2 drops into both eyes daily as needed for dry eyes., Disp: , Rfl:  .  ibuprofen (ADVIL,MOTRIN) 200 MG tablet, Take 200-800 mg by mouth every 8 (eight) hours as needed for headache or moderate pain., Disp: , Rfl:  .  NEOMYCIN-POLYMYXIN-HYDROCORTISONE (CORTISPORIN) 1 % SOLN OTIC solution, Apply 1-2 drops to toe BID after soaking, Disp: 10 mL, Rfl: 1 .  Prenatal Vit-Fe Fumarate-FA (MULTIVITAMIN-PRENATAL) 27-0.8 MG TABS tablet, Take 1 tablet by mouth daily., Disp: , Rfl:   Allergies  Allergen Reactions  . Penicillins Rash   Review of Systems Objective:   Vitals:   05/22/18 1045  BP: 135/88  Pulse: 69    General: Well developed, nourished, in no acute distress, alert and oriented x3   Dermatological: Skin is warm, dry and supple bilateral. Nails x 10 are well maintained; remaining integument appears unremarkable at this time. There are no open sores, no preulcerative lesions, no rash or signs of infection present.  Ingrown toenail tibial  border hallux left.  Mild erythema no purulence no malodor tender on palpation.  Vascular: Dorsalis Pedis artery and Posterior Tibial artery pedal pulses are 2/4 bilateral with immedate capillary fill time. Pedal hair growth present. No varicosities and no lower extremity edema present bilateral.   Neruologic: Grossly intact via light touch bilateral. Vibratory intact via tuning fork bilateral. Protective threshold with Semmes Wienstein monofilament intact to all pedal sites bilateral. Patellar and Achilles deep tendon reflexes 2+ bilateral. No Babinski or clonus noted bilateral.   Musculoskeletal: No gross boney pedal deformities bilateral. No pain, crepitus, or limitation noted with foot and ankle range of motion bilateral. Muscular strength 5/5 in all groups tested bilateral.  Adductovarus rotated hammertoe deformity fifth right more rigid in nature.  She also has some dorsiflexion at the level of the fifth metatarsal phalangeal joint.  Mild hallux interphalangeal bilateral.  Gait: Unassisted, Nonantalgic.    Radiographs:  None taken  Assessment & Plan:   Assessment: Ingrown toenail tibial border hallux left.  Hammertoe deformity with contracted fifth metatarsal phalangeal joint adductovarus rotated hammertoe deformity fifth right  Plan: Discussed etiology pathology conservative versus surgical therapies.  At this point after 3 cc of a 50-50 mixture of Marcaine plain and lidocaine plain were infiltrated in a hallux block left foot.  A chemical matrixectomy was performed along the tibial border.  She tolerated procedure well without complications.  She is provided with both oral and written home-going  instructions for care and soaking of her toe as well as a prescription for Cortisporin Otic to be applied twice daily.  I would like to follow-up with her in 1 to 2 weeks.     Rabab Currington T. [place], [place]

## 2018-05-22 NOTE — Patient Instructions (Signed)

## 2018-05-24 ENCOUNTER — Ambulatory Visit
Admission: RE | Admit: 2018-05-24 | Discharge: 2018-05-24 | Disposition: A | Discharge status: Home / Self Care | Payer: BC Managed Care – PPO | Source: Ambulatory Visit | Attending: Family Medicine | Admitting: Family Medicine

## 2018-05-24 ENCOUNTER — Other Ambulatory Visit: Payer: Self-pay | Admitting: Family Medicine

## 2018-05-24 DIAGNOSIS — Z1231 Encounter for screening mammogram for malignant neoplasm of breast: Secondary | ICD-10-CM

## 2018-05-24 IMAGING — MG DIGITAL SCREENING BILATERAL MAMMOGRAM WITH CAD
4 series · 4 of 4 positions shown · non-contrast
Comparison: Previous exam(s).

CLINICAL DATA: Screening.

EXAM:
DIGITAL SCREENING BILATERAL MAMMOGRAM WITH CAD

[L MLO]
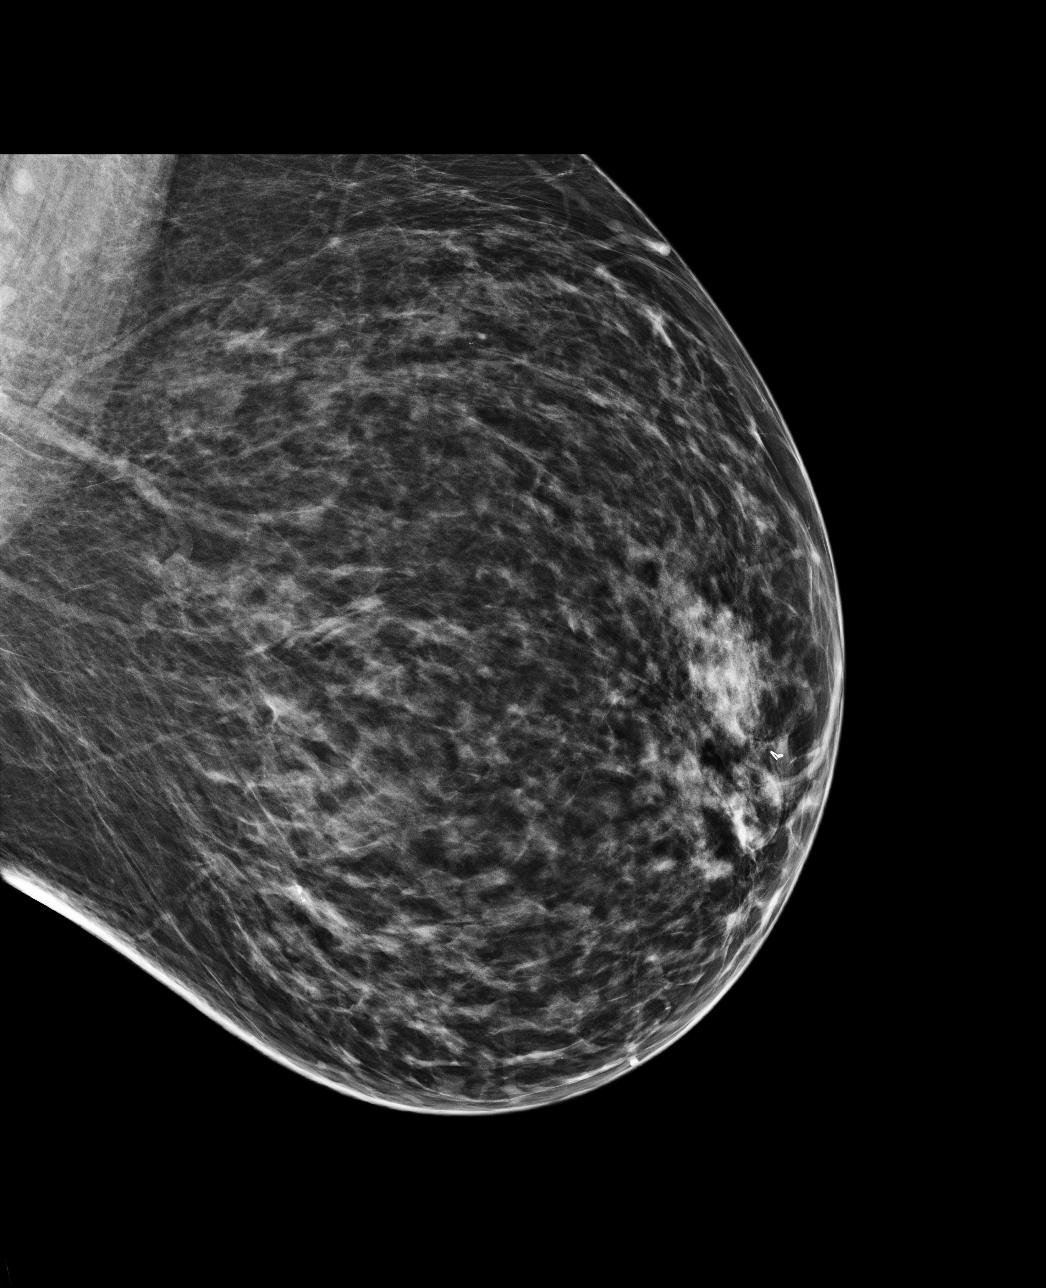

[L CC]
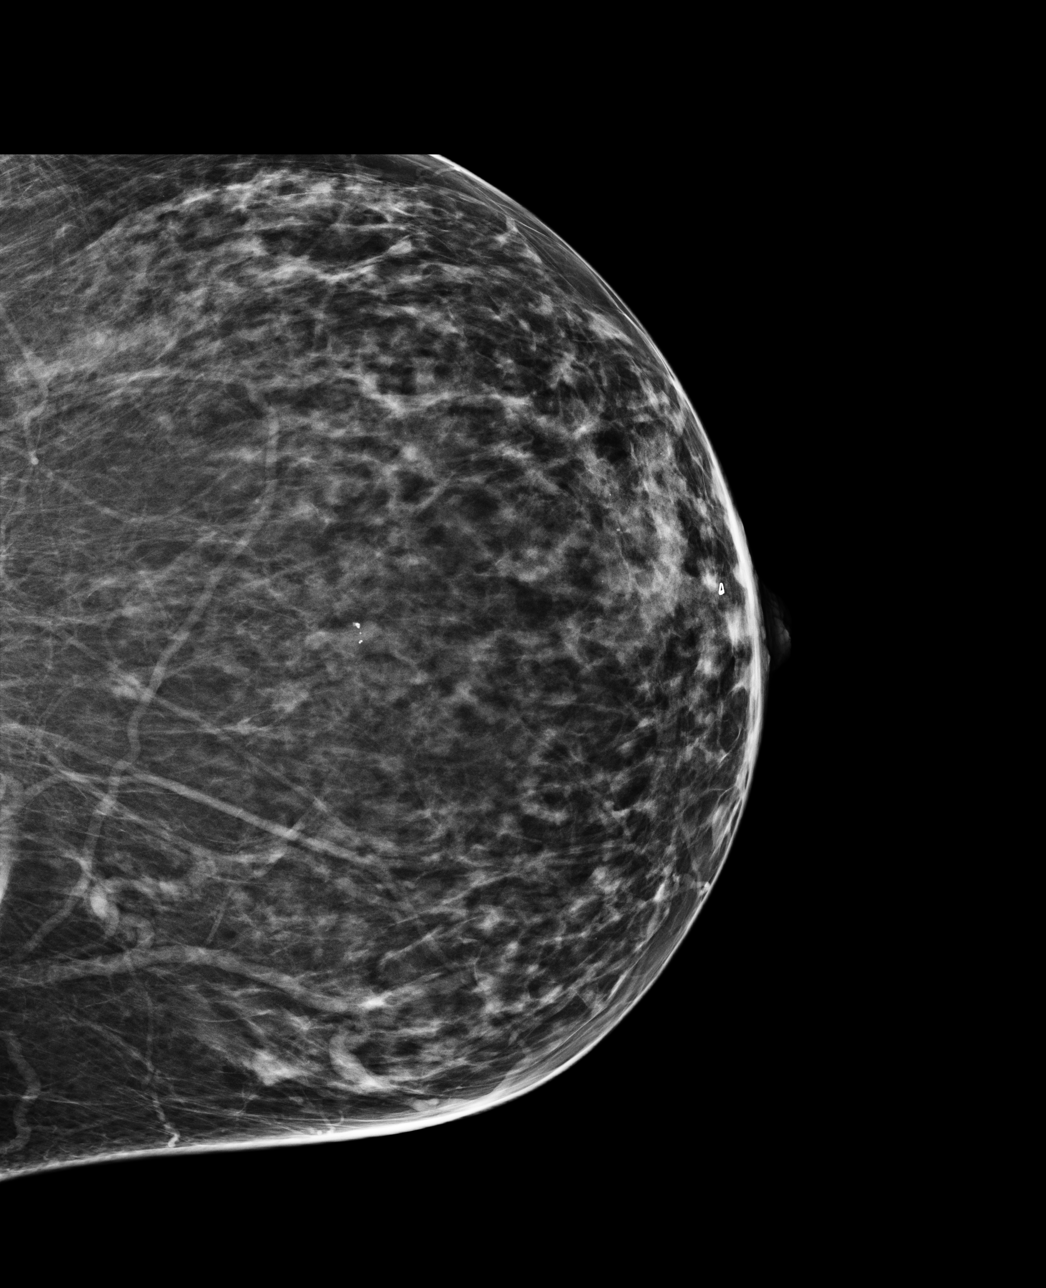

[R CC]
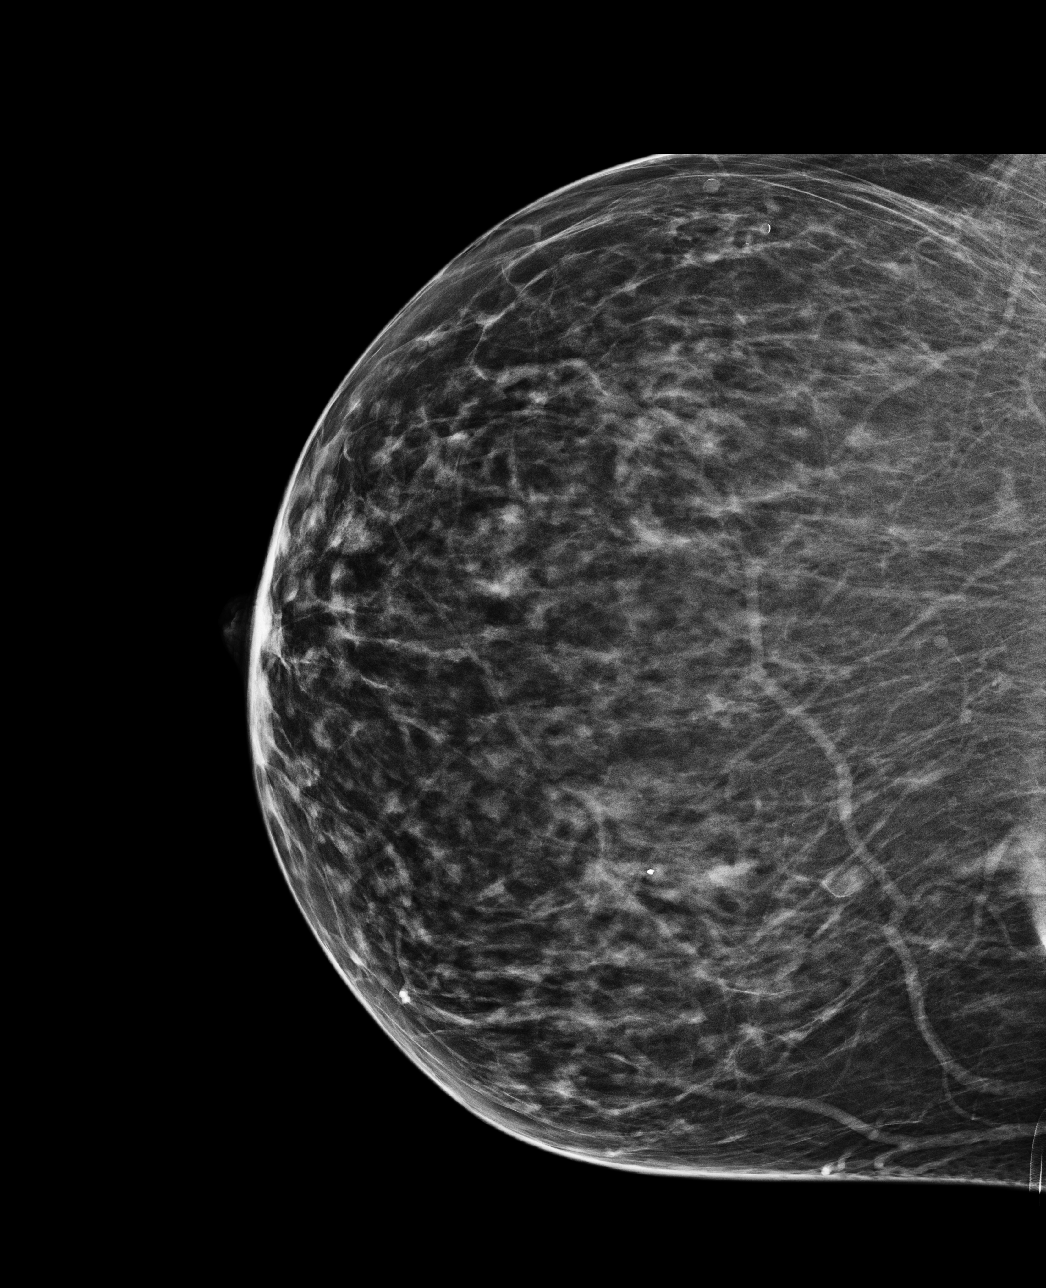

[R MLO]
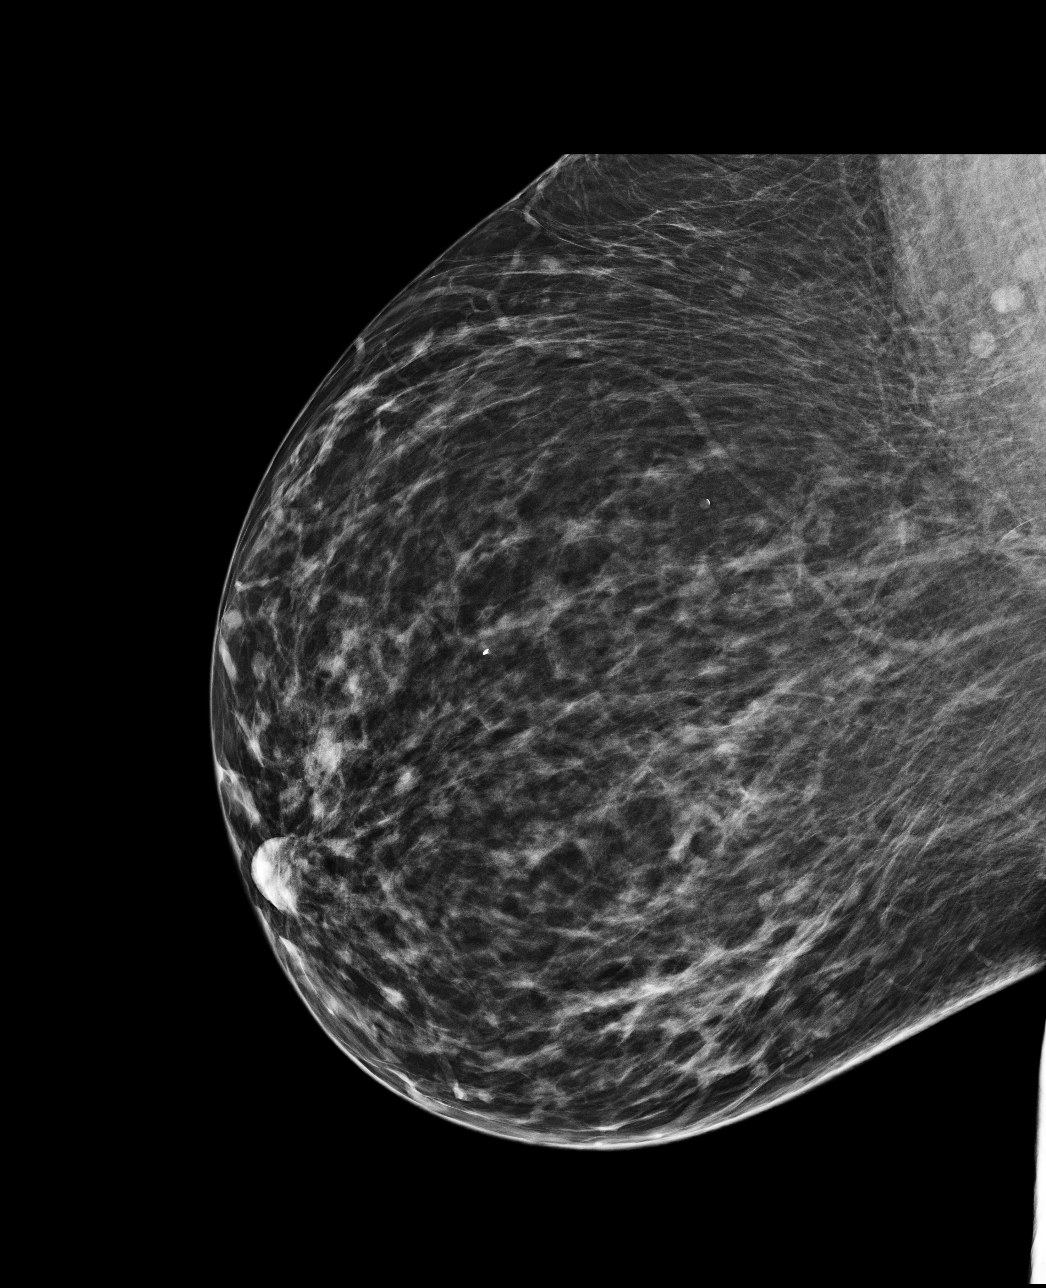

[4 of 4 positions shown; findings below may reference images not displayed]

ACR Breast Density Category c: The breast tissue is heterogeneously
dense, which may obscure small masses.
FINDINGS: There are no findings suspicious for malignancy. Images were
processed with CAD.
IMPRESSION: No mammographic evidence of malignancy. A result letter of this
screening mammogram will be mailed directly to the patient.

RECOMMENDATION:
Screening mammogram in one year. (Code:[0J])

BI-RADS CATEGORY  1: Negative.

## 2018-06-07 ENCOUNTER — Ambulatory Visit (INDEPENDENT_AMBULATORY_CARE_PROVIDER_SITE_OTHER): Payer: BC Managed Care – PPO | Admitting: Podiatry

## 2018-06-07 DIAGNOSIS — L600 Ingrowing nail: Secondary | ICD-10-CM

## 2018-06-07 NOTE — Progress Notes (Signed)
Patient is here for follow-up appointment, procedure performed 05/22/2018, removal of ingrown toenail tibial border hallux nail left toe.  Patient states that her nail has not been hurting until today.  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  No redness, no erythema, there is some mild serosanguineous drainage, but no other signs and symptoms of infection.  Noted well-healing area, beginning to scab over.  We discussed signs and symptoms of infection, verbal and written instructions were dispensed.  She is to follow-up with any acute symptom changes.

## 2018-06-07 NOTE — Patient Instructions (Signed)

## 2019-01-01 ENCOUNTER — Ambulatory Visit: Payer: BC Managed Care – PPO | Admitting: Podiatry

## 2019-01-01 ENCOUNTER — Other Ambulatory Visit: Payer: Self-pay

## 2019-01-01 ENCOUNTER — Ambulatory Visit (INDEPENDENT_AMBULATORY_CARE_PROVIDER_SITE_OTHER): Payer: BC Managed Care – PPO

## 2019-01-01 ENCOUNTER — Encounter: Payer: Self-pay | Admitting: Podiatry

## 2019-01-01 ENCOUNTER — Other Ambulatory Visit: Payer: Self-pay | Admitting: Podiatry

## 2019-01-01 VITALS — Temp 97.7°F

## 2019-01-01 DIAGNOSIS — M722 Plantar fascial fibromatosis: Secondary | ICD-10-CM

## 2019-01-01 DIAGNOSIS — M7742 Metatarsalgia, left foot: Secondary | ICD-10-CM

## 2019-01-01 DIAGNOSIS — M7750 Other enthesopathy of unspecified foot: Secondary | ICD-10-CM

## 2019-01-01 MED ORDER — METHYLPREDNISOLONE 4 MG PO TBPK: ORAL_TABLET | ORAL | 0 refills | Status: DC

## 2019-01-01 NOTE — Progress Notes (Signed)
She presents today chief complaint of a painful first metatarsal phalangeal joint painful lateral aspect of her left foot and across the top.  States that she has had some heel pain.  States that she her shoes that she wears regularly are older and probably need to be replaced.  Objective: Vital signs are stable alert and oriented x3.  Pulses are palpable.  Neurologic sensorium is intact.  DP reflexes are intact.  Muscle strength is normal symmetrical.  She has good range of motion on palpation of the first metatarsal phalangeal joint she has tenderness on palpation of the fourth fifth TMT joint.  She also has pain on palpation medial calcaneal tubercle of the left heel.  Radiographs taken today demonstrate an osseously mature individual retrocalcaneal heel spur plantar distally oriented calcaneal heel spur with soft tissue increase in density plantar fascial cannula surgical site.  Forefoot demonstrates no acute findings.  Assessment: Forefoot metatarsalgia and capsulitis associated with plantar fasciitis rear foot left.  Plan: Discussed etiology pathology and surgical therapies offered injection which she declined.  I started her on a Medrol Dosepak and she declined the meloxicam.  Placed in a plantar fascial brace and a night splint discussed appropriate shoe gear stretching exercise ice therapy and shoe gear gear modification.  She understands this is amenable to it we will follow-up with me in a month or so.

## 2019-01-01 NOTE — Patient Instructions (Signed)

## 2019-01-29 ENCOUNTER — Ambulatory Visit: Payer: BC Managed Care – PPO | Admitting: Podiatry

## 2019-05-23 ENCOUNTER — Ambulatory Visit: Payer: BC Managed Care – PPO | Admitting: Orthopedic Surgery

## 2019-06-19 ENCOUNTER — Other Ambulatory Visit: Payer: Self-pay | Admitting: Family Medicine

## 2019-06-19 DIAGNOSIS — Z1231 Encounter for screening mammogram for malignant neoplasm of breast: Secondary | ICD-10-CM

## 2019-08-21 ENCOUNTER — Other Ambulatory Visit: Payer: Self-pay

## 2019-08-21 ENCOUNTER — Ambulatory Visit
Admission: RE | Admit: 2019-08-21 | Discharge: 2019-08-21 | Disposition: A | Discharge status: Home / Self Care | Payer: BC Managed Care – PPO | Source: Ambulatory Visit | Attending: Family Medicine | Admitting: Family Medicine

## 2019-08-21 ENCOUNTER — Other Ambulatory Visit: Payer: Self-pay | Admitting: Family Medicine

## 2019-08-21 DIAGNOSIS — Z1231 Encounter for screening mammogram for malignant neoplasm of breast: Secondary | ICD-10-CM

## 2019-08-21 IMAGING — MG DIGITAL SCREENING BILAT W/ CAD
4 series · 4 of 4 positions shown · non-contrast
Comparison: Previous exam(s).

CLINICAL DATA: Screening.

EXAM:
DIGITAL SCREENING BILATERAL MAMMOGRAM WITH CAD

[R MLO]
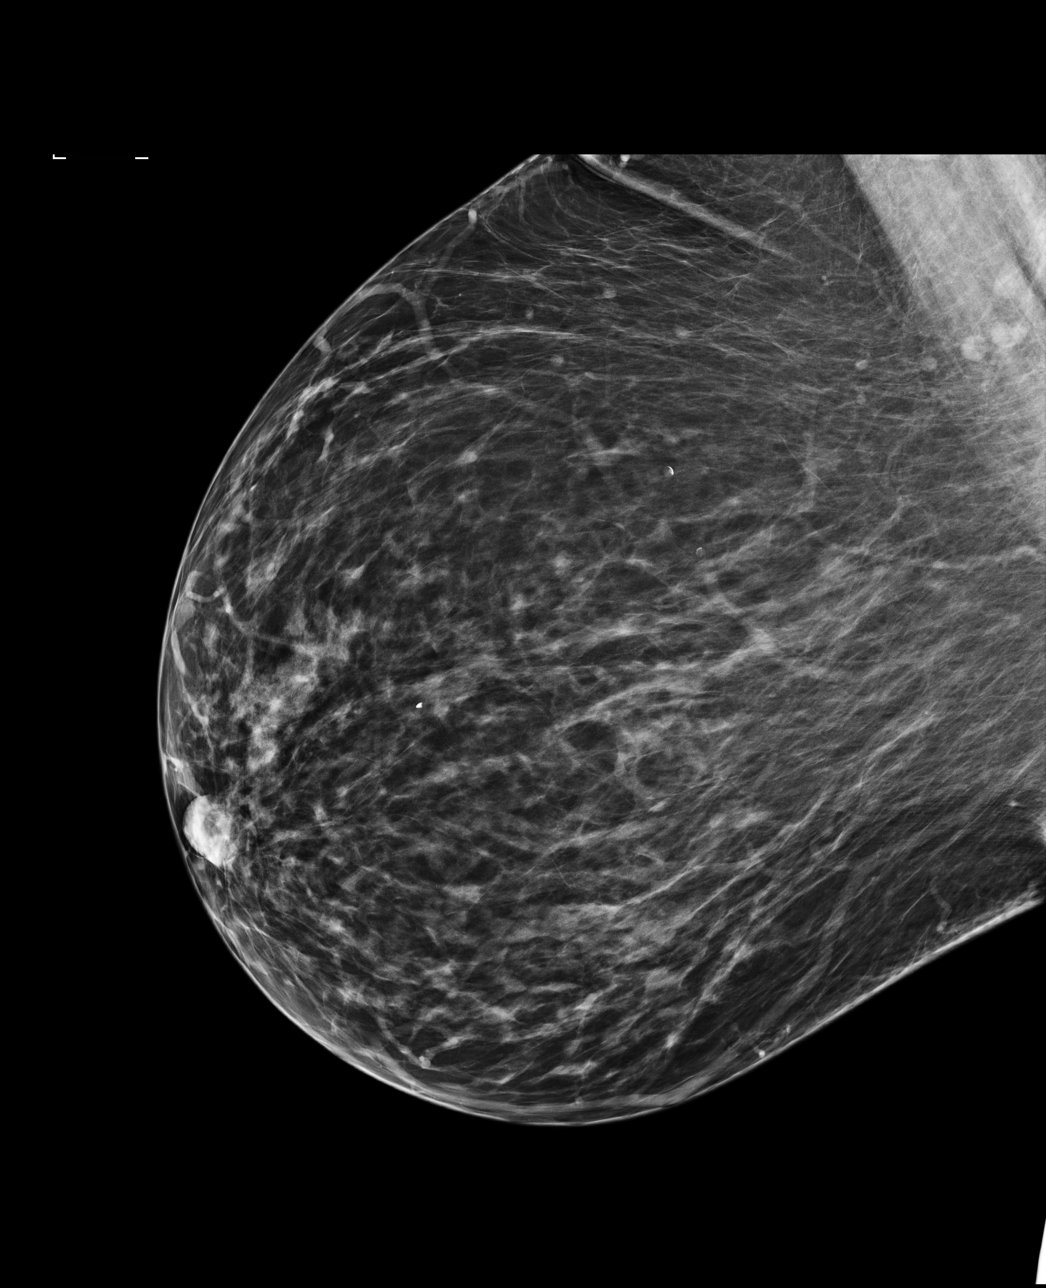

[R CC]
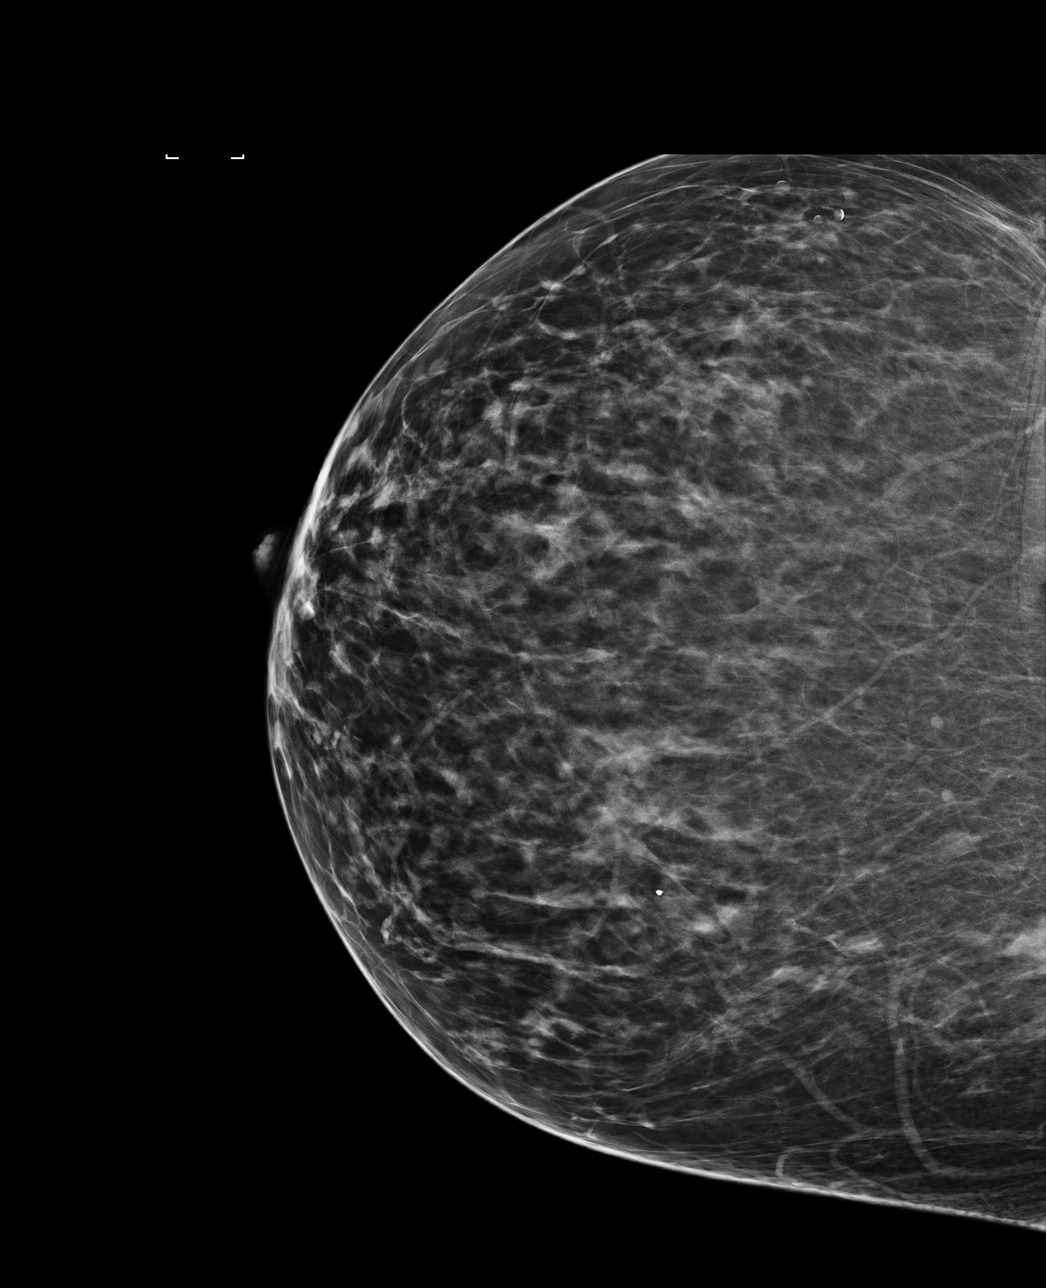

[L CC]
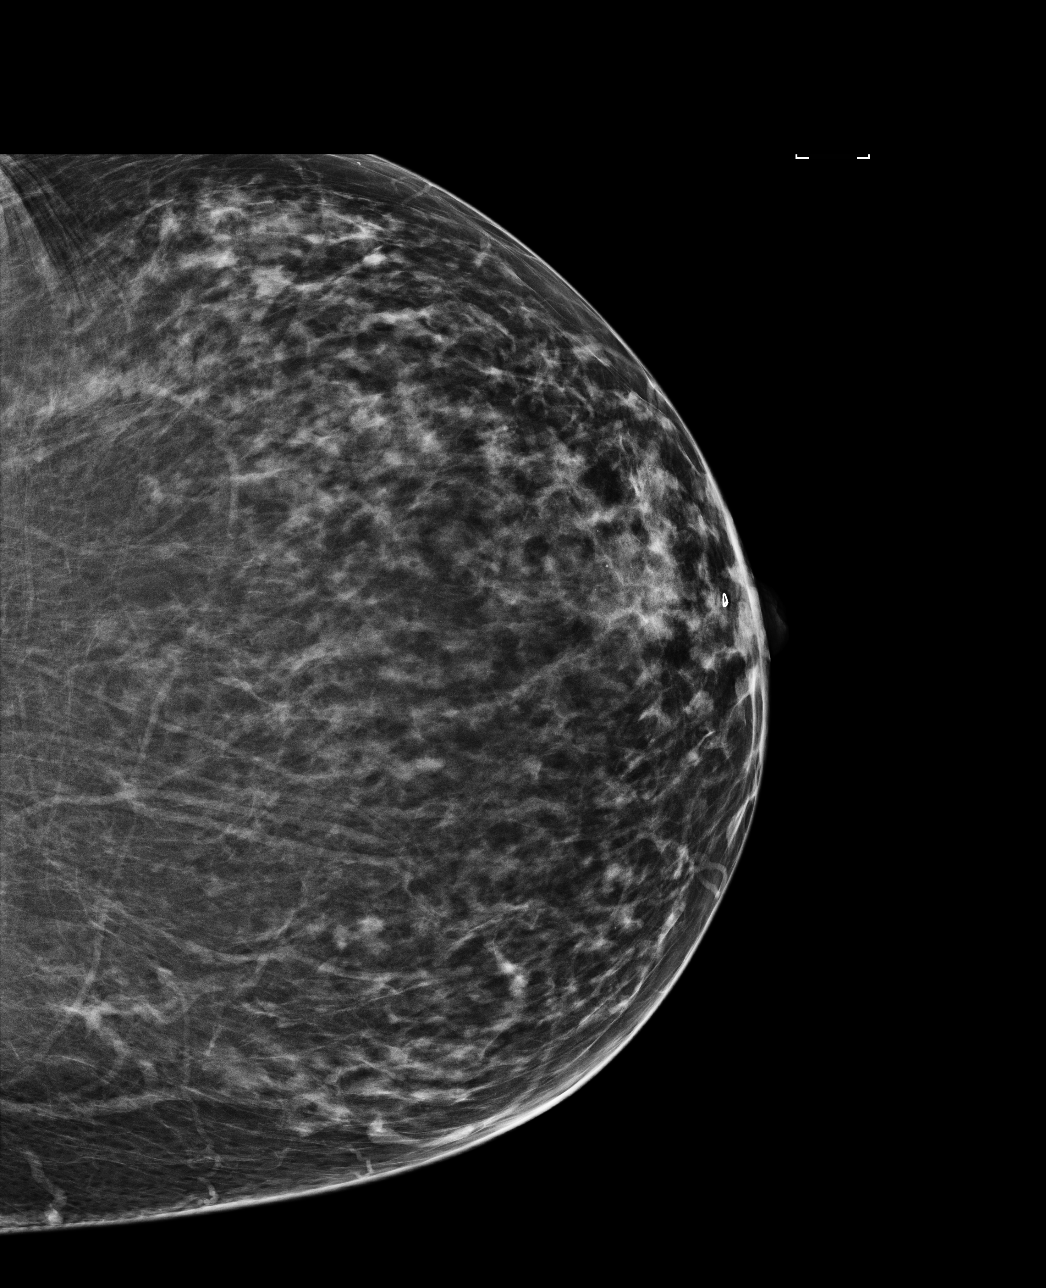

[L MLO]
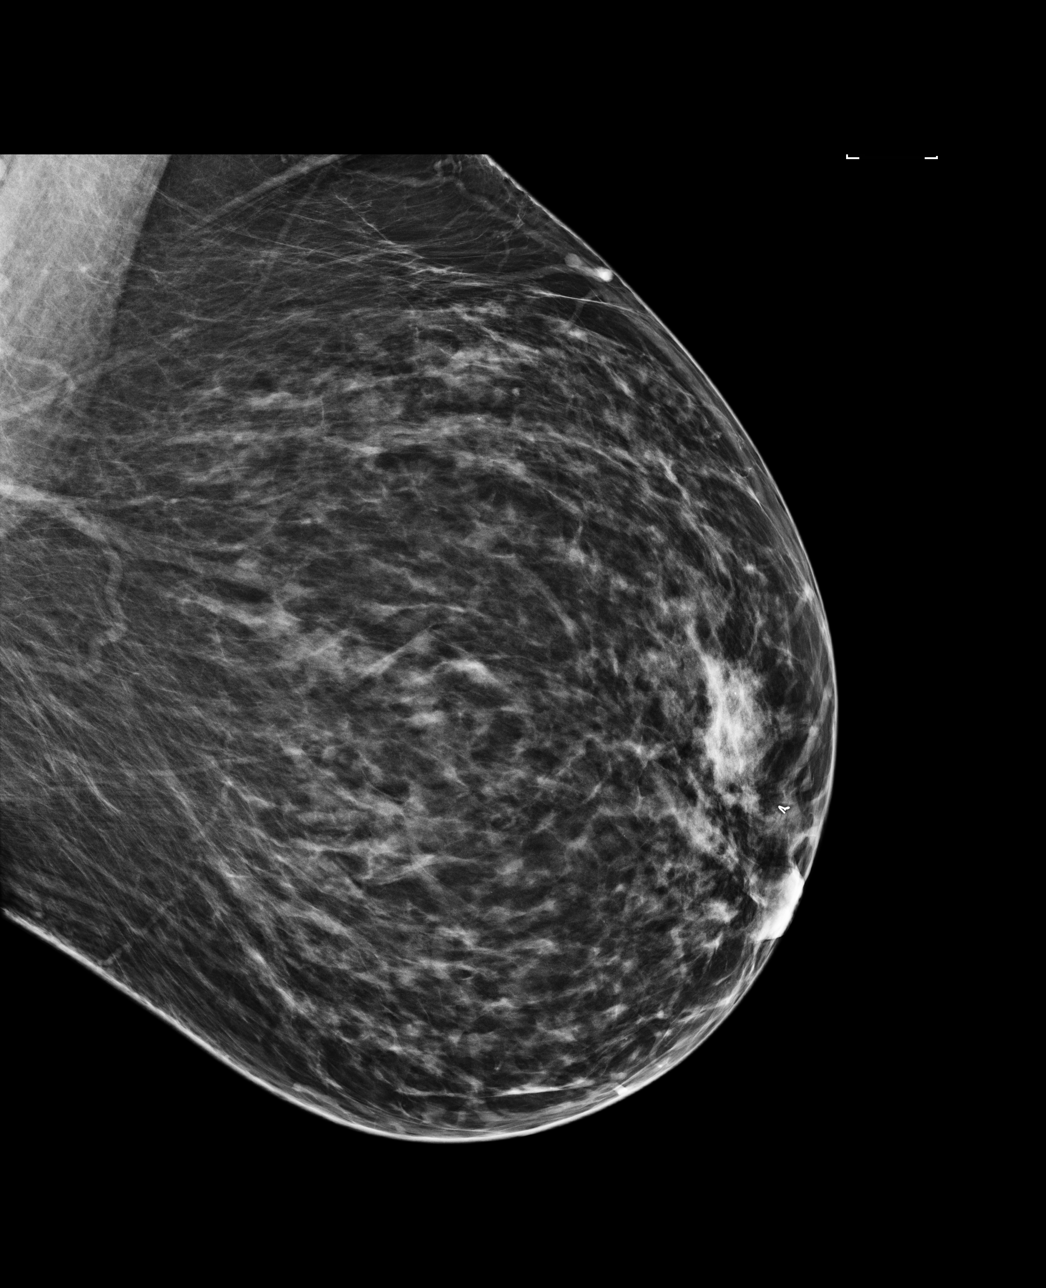

[4 of 4 positions shown; findings below may reference images not displayed]

ACR Breast Density Category b: There are scattered areas of
fibroglandular density.
FINDINGS: There are no findings suspicious for malignancy. Images were
processed with CAD.
IMPRESSION: No mammographic evidence of malignancy. A result letter of this
screening mammogram will be mailed directly to the patient.

RECOMMENDATION:
Screening mammogram in one year. (Code:[US])

BI-RADS CATEGORY  1: Negative.

## 2019-09-17 ENCOUNTER — Ambulatory Visit: Payer: BC Managed Care – PPO | Admitting: Podiatry

## 2019-09-17 ENCOUNTER — Encounter: Payer: Self-pay | Admitting: Podiatry

## 2019-09-17 ENCOUNTER — Other Ambulatory Visit: Payer: Self-pay

## 2019-09-17 DIAGNOSIS — M7661 Achilles tendinitis, right leg: Secondary | ICD-10-CM

## 2019-09-17 DIAGNOSIS — M722 Plantar fascial fibromatosis: Secondary | ICD-10-CM

## 2019-09-17 DIAGNOSIS — M778 Other enthesopathies, not elsewhere classified: Secondary | ICD-10-CM | POA: Diagnosis not present

## 2019-09-17 NOTE — Progress Notes (Signed)
She presents today for follow-up of her capsulitis plantar fasciitis bilaterally.  States that the left foot bothers her worse than that of the right foot.  Pain is in both heels she has been wearing her plantar fascial braces and the second toe of the left foot is moving toward the big toe and is tender.  Objective: Vital signs are stable she is alert and oriented x3.  Pulses are palpable.  She has pain on palpation medial calcaneal tubercle of the left heel capsulitis and pain on palpation of the second metatarsal phalangeal joint left and pain on palpation of the subtalar joint left.  Moderate plantar fasciitis right over left with pes planus bilaterally.  Assessment: Plantar fasciitis bilaterally capsulitis subtalar joint and second metatarsophalangeal joints left.  Plan: Discussed etiology pathology conservative or surgical therapies at this point I injected subtalar joint and second metatarsophalangeal joint 20 mg and 10 mg respectively of Kenalog and to the plantar fascia of the right heel 20 mg of Kenalog.  She tolerated procedures well without complications I will follow-up with her in about 6 weeks.

## 2019-10-01 ENCOUNTER — Ambulatory Visit: Payer: BC Managed Care – PPO | Attending: Internal Medicine

## 2019-10-01 DIAGNOSIS — Z20822 Contact with and (suspected) exposure to covid-19: Secondary | ICD-10-CM

## 2019-10-03 LAB — NOVEL CORONAVIRUS, NAA: SARS-CoV-2, NAA: NOT DETECTED

## 2019-10-09 ENCOUNTER — Ambulatory Visit: Payer: BC Managed Care – PPO | Attending: Internal Medicine

## 2019-10-09 DIAGNOSIS — Z20822 Contact with and (suspected) exposure to covid-19: Secondary | ICD-10-CM

## 2019-10-11 LAB — NOVEL CORONAVIRUS, NAA: SARS-CoV-2, NAA: NOT DETECTED

## 2019-10-29 ENCOUNTER — Ambulatory Visit: Payer: BC Managed Care – PPO | Admitting: Podiatry

## 2019-10-29 ENCOUNTER — Other Ambulatory Visit: Payer: Self-pay

## 2019-10-29 ENCOUNTER — Ambulatory Visit: Payer: BC Managed Care – PPO

## 2019-10-29 DIAGNOSIS — S86011A Strain of right Achilles tendon, initial encounter: Secondary | ICD-10-CM | POA: Diagnosis not present

## 2019-10-29 DIAGNOSIS — M7661 Achilles tendinitis, right leg: Secondary | ICD-10-CM

## 2019-10-29 NOTE — Progress Notes (Signed)
She presents today after having not seen her since mid December with a chief complaint of painful right plantar and posterior heel pain.  She states that it did get some better but he went right back down to ground 0.  She has been wearing her plantar fascial brace her night splint taking her anti-inflammatories all to no avail.  Is currently wearing heel lifts in her shoes to help take the pressure off of the Achilles.  Objective pain in limb secondary to insertional Achilles tendinitis dorsal lateral aspect she also has some minor plantar fasciitis on palpation right foot.  Pulses are palpable neurologic sensorium is unchanged.  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  Plan: Discussed etiology pathology conservative versus surgical therapies all conservative therapies have failed including steroids nonsteroidals anti-inflammatories injections cortisone and immobilization.  At this point we will going to request an MRI of the right rear foot.

## 2019-10-29 NOTE — Addendum Note (Signed)
Addended by: Kristian Covey on: 10/29/2019 03:41 PM   Modules accepted: Orders

## 2019-11-23 ENCOUNTER — Ambulatory Visit
Admission: RE | Admit: 2019-11-23 | Discharge: 2019-11-23 | Disposition: A | Discharge status: Home / Self Care | Payer: BC Managed Care – PPO | Source: Ambulatory Visit | Attending: Podiatry | Admitting: Podiatry

## 2019-11-23 DIAGNOSIS — S86011A Strain of right Achilles tendon, initial encounter: Secondary | ICD-10-CM

## 2019-11-23 IMAGING — MR MR ANKLE*R* W/O CM
5 series · 40 of 40 positions shown · non-contrast
Comparison: None.

CLINICAL DATA: Swelling in the back of the heel. Getting worse over
the past month.

EXAM:
MRI OF THE RIGHT ANKLE WITHOUT CONTRAST
TECHNIQUE: Multiplanar, multisequence MR imaging of the ankle was performed. No
intravenous contrast was administered.

[Series 4: T2 fat-sat · axial · 3.0mm · 0.50mm/px · z∈[-116,+36]mm · 10 of 40 slices shown (1 of 2)]
[im 1/40]
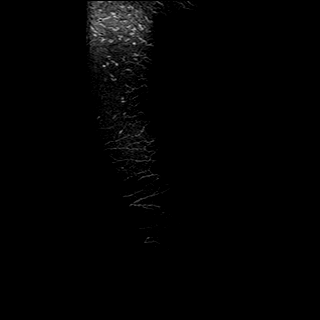
[im 5/40]
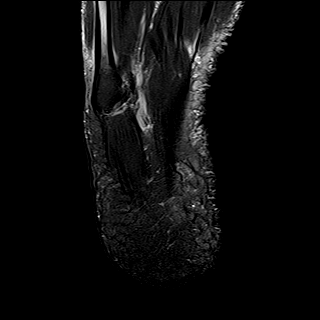
[im 9/40]
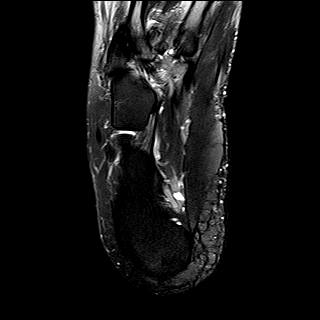
[im 14/40]
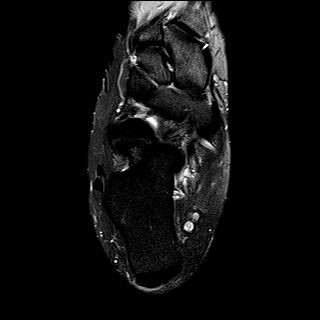
[im 18/40]
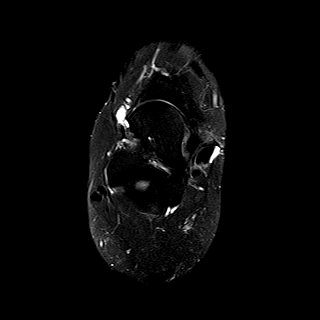
[im 22/40]
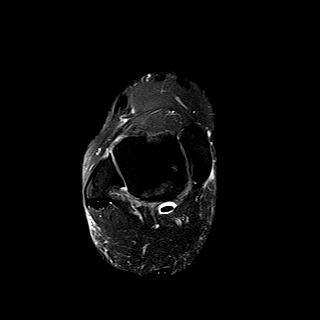
[im 27/40]
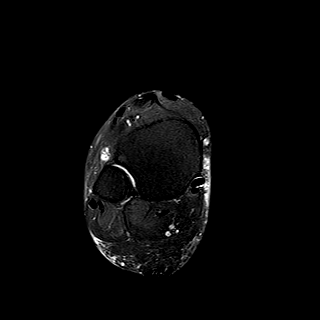
[im 31/40]
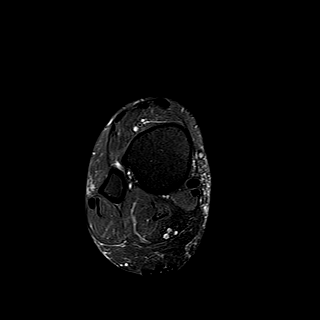
[im 35/40]
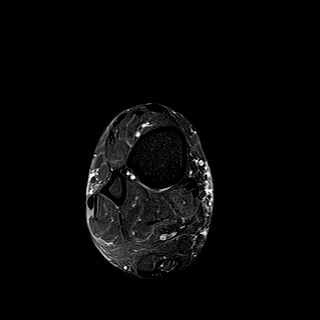
[im 40/40]
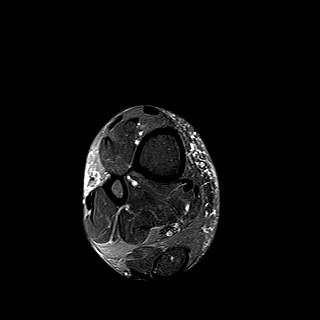

[Series 5: T1 · axial · 3.0mm · 0.50mm/px · z∈[-116,+36]mm · 11 of 40 slices shown (1 of 2)]
[im 1/40]
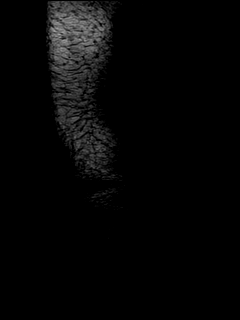
[im 4/40]
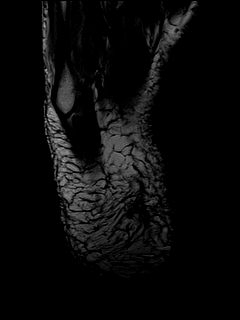
[im 8/40]
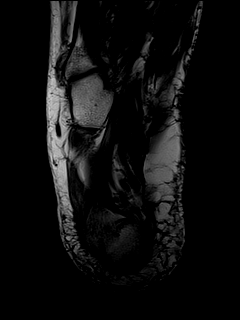
[im 12/40]
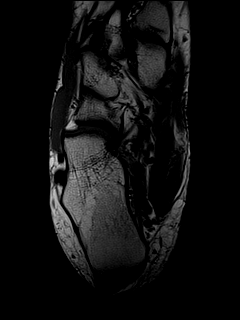
[im 16/40]
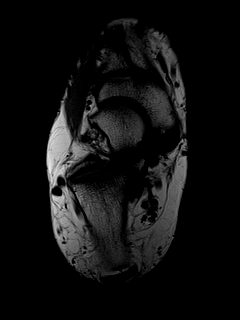
[im 20/40]
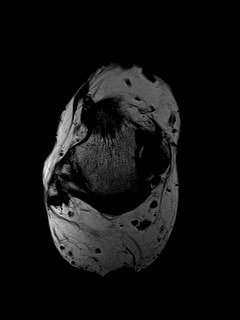
[im 24/40]
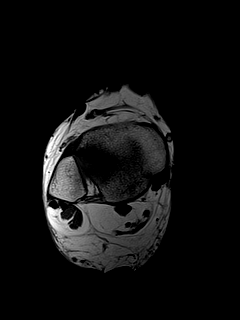
[im 28/40]
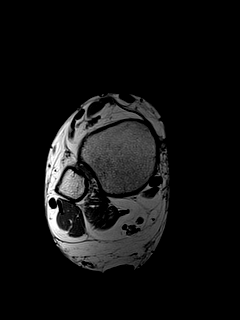
[im 32/40]
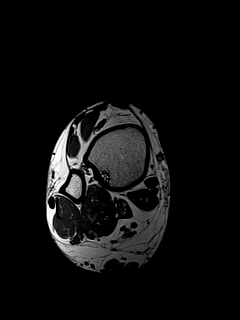
[im 36/40]
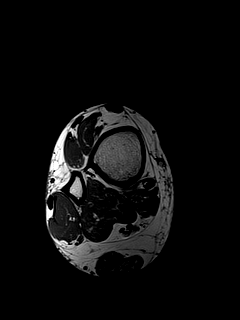
[im 40/40]
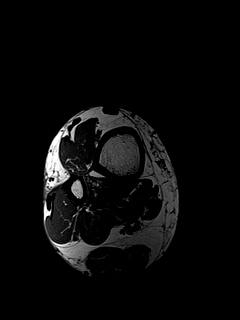

[Series 6: T1 · sagittal · 4.0mm · 0.59mm/px · 5 of 18 slices shown (2 of 2)]
[im 1/18]
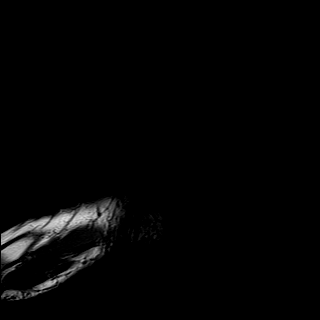
[im 5/18]
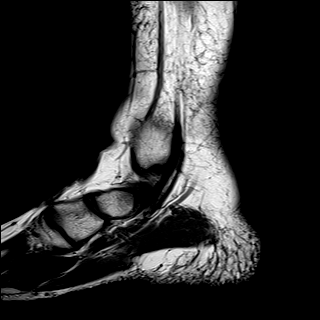
[im 9/18]
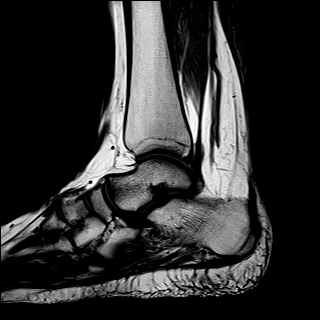
[im 13/18]
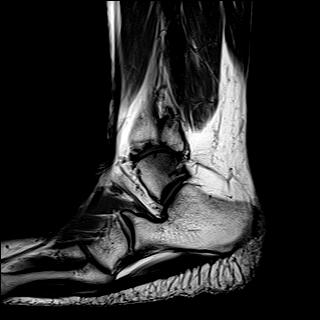
[im 18/18]
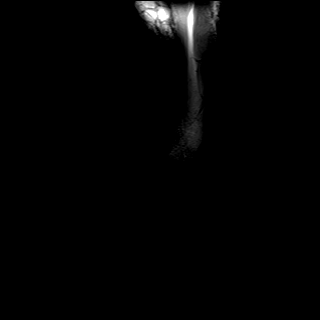

[Series 7: STIR · sagittal · 4.0mm · 0.37mm/px · 5 of 18 slices shown]
[im 1/18]
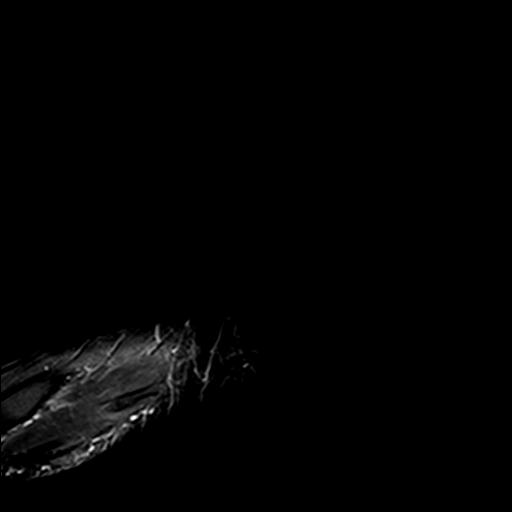
[im 5/18]
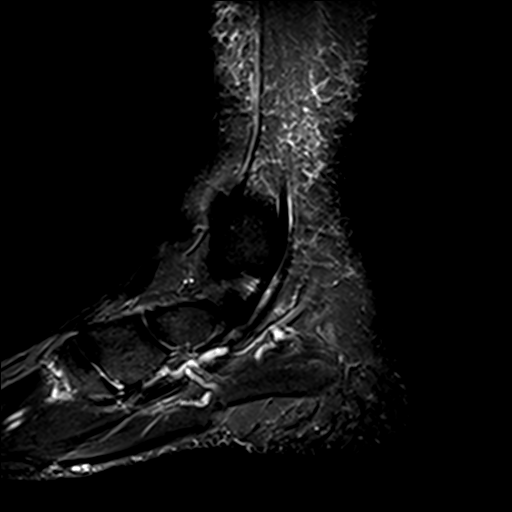
[im 9/18]
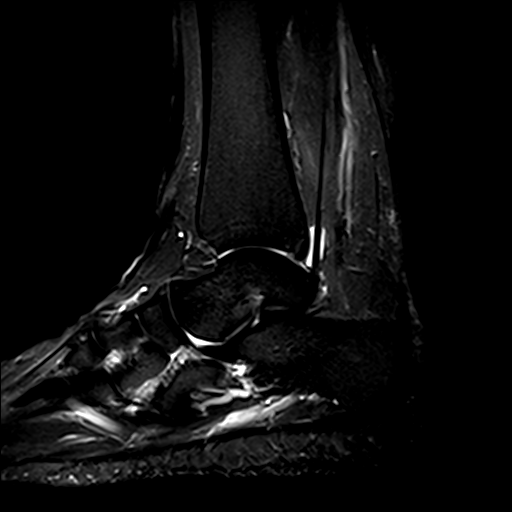
[im 13/18]
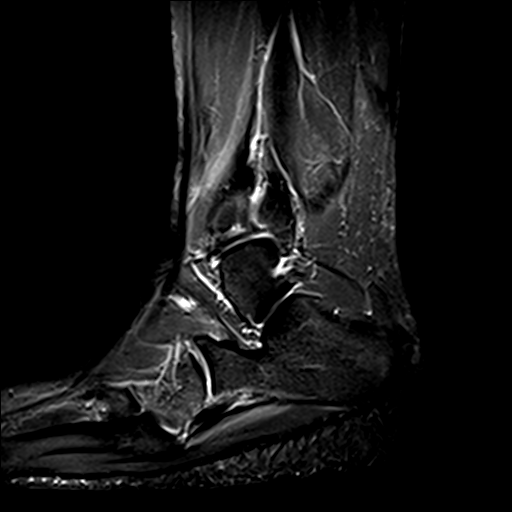
[im 18/18]
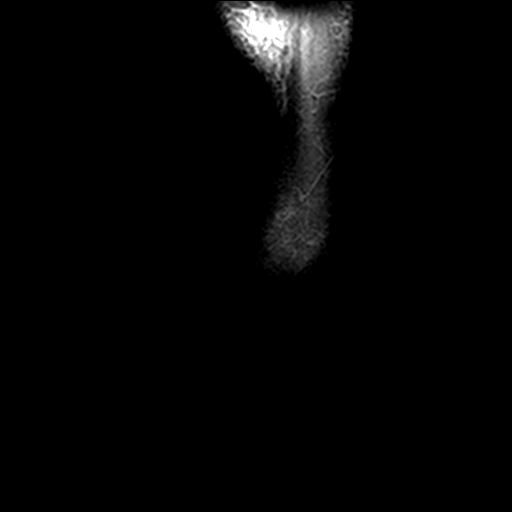

[Series 8: T2 fat-sat · coronal · 3.0mm · 0.53mm/px · 9 of 35 slices shown (2 of 2)]
[im 1/35]
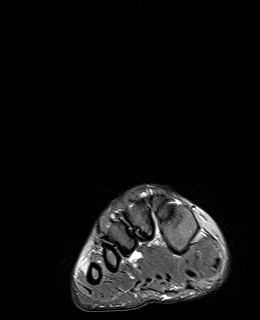
[im 5/35]
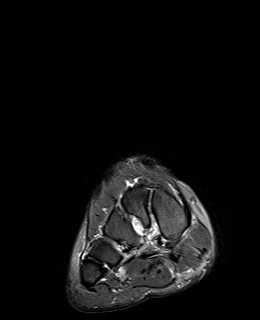
[im 9/35]
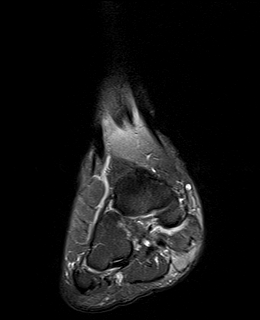
[im 13/35]
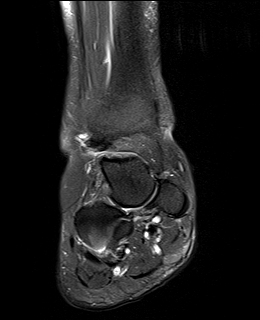
[im 18/35]
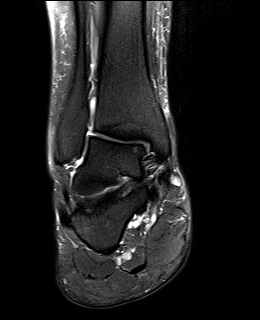
[im 22/35]
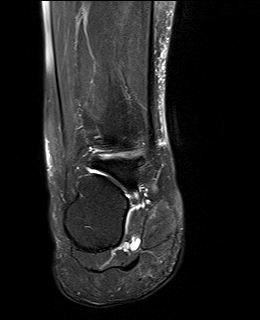
[im 26/35]
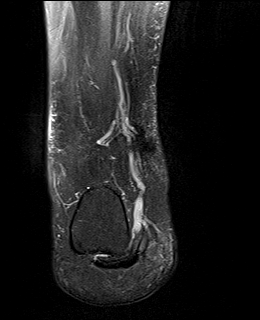
[im 30/35]
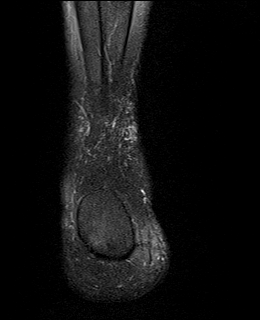
[im 35/35]
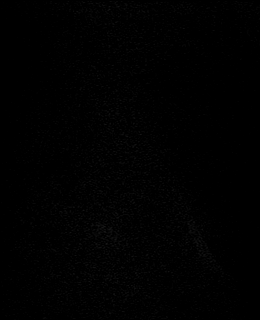

[40 of 40 positions shown; findings below may reference images not displayed]

FINDINGS: TENDONS

Peroneal: Peroneal longus tendon intact. Peroneal brevis intact.

Posteromedial: Posterior tibial tendon intact. Flexor hallucis
longus tendon intact. Flexor digitorum longus tendon intact.

Anterior: Tibialis anterior tendon intact. Extensor hallucis longus
tendon intact Extensor digitorum longus tendon intact.

Achilles: Achilles tendon is intact without a tear with minimal
tendinosis. Small amount of fluid superficial to the distal aspect
of the Achilles tendon along the posterior margin most consistent
with mild peritenonitis. Enthesopathic changes at the Achilles
tendon insertion.

Plantar Fascia: Intact. Small plantar calcaneal spur.

LIGAMENTS

Lateral: Anterior talofibular ligament intact. Calcaneofibular
ligament intact. Posterior talofibular ligament intact. Anterior and
posterior tibiofibular ligaments intact.

Medial: Deltoid ligament intact. Spring ligament intact.

CARTILAGE

Ankle Joint: No joint effusion. Normal ankle mortise. Mild cartilage
irregularity and minimal subchondral reactive marrow edema in the
medial corner of the talar dome. Small anterior tibiotalar marginal
osteophytes.

Subtalar Joints/Sinus Tarsi: Normal subtalar joints. No subtalar
joint effusion. Normal sinus tarsi.

Bones: No acute fracture or dislocation. No aggressive osseous
lesion. Mild subcortical reactive marrow changes along the
anterolateral distal tibial epiphysis at the distal tibiofibular
joint just superior to the anterior tibiofibular ligament. Mild
cartilage irregularity and minimal subchondral reactive marrow edema
in the medial corner of the talar dome. Relative pes planus.

Soft Tissue: Muscles are normal.  No muscle atrophy.
IMPRESSION: 1. Achilles tendon is intact without a tear with minimal tendinosis.
Small amount of fluid superficial to the distal aspect of the
Achilles tendon along the posterior margin most consistent with mild
peritenonitis. Enthesopathic changes at the Achilles tendon
insertion.
2. Mild subcortical reactive marrow changes along the anterolateral
distal tibial epiphysis at the distal tibiofibular joint just
superior to the anterior tibiofibular ligament.

## 2019-11-26 ENCOUNTER — Telehealth: Payer: Self-pay | Admitting: *Deleted

## 2019-11-26 NOTE — Telephone Encounter (Signed)
-----   Message from Elinor Parkinson, [place] sent at 11/26/2019  1:37 PM EST ----- Send for overread and inform pt of delay

## 2019-11-26 NOTE — Telephone Encounter (Signed)
Left message informing pt of Dr. Geryl Rankins request to send a copy of the MRI disc to a radiology specialist for more information for detail planning, there would be a 10-14 day delay in the final results and we would call with instructions once received. Mailed MRI disc to [facility].

## 2019-12-18 ENCOUNTER — Telehealth: Payer: Self-pay | Admitting: *Deleted

## 2019-12-18 NOTE — Telephone Encounter (Signed)
-----   Message from Lanney Gins, [facility] sent at 12/18/2019  4:10 PM EDT ----- Tiffany Mullins, patient called and stated that she was calling to see what the results of the MRI was and the phone Mullins is [phone]. Thanks Misty Stanley

## 2019-12-18 NOTE — Telephone Encounter (Signed)
Val, can you check on this overread. Its not in the chart. Thank you!

## 2019-12-19 NOTE — Telephone Encounter (Signed)
Unable to leave a message the mailbox is full. °

## 2019-12-19 NOTE — Telephone Encounter (Signed)
Per Dr. Al Corpus- MRI overread reviewed - patient can try PT to help treat the achilles tendonitis or surgery to remove the posterior heel spur. If patient would like surgery, please have her schedule a consult appointment.

## 2019-12-20 NOTE — Telephone Encounter (Signed)
Pt called states she wants to know what is wrong with her foot and the results of her MRI.

## 2019-12-20 NOTE — Telephone Encounter (Signed)
Left message requesting a call back to discuss her MRI results.

## 2019-12-20 NOTE — Telephone Encounter (Signed)
I informed pt Dr. Al Corpus had reviewed the overread and that she could decide to take PT for the achilles tendonitis, but if she wanted surgery for the posterior heel spur she would need an appt to discuss and sign consent forms. Pt states she would like to think about it and call again. I told pt that would be fine.

## 2020-01-14 ENCOUNTER — Encounter: Payer: Self-pay | Admitting: Podiatry

## 2020-01-15 ENCOUNTER — Telehealth: Payer: Self-pay | Admitting: *Deleted

## 2020-01-15 DIAGNOSIS — M7661 Achilles tendinitis, right leg: Secondary | ICD-10-CM

## 2020-01-15 NOTE — Telephone Encounter (Signed)
Left message for pt to call for results  

## 2020-01-15 NOTE — Telephone Encounter (Signed)
-----   Message from Elinor Parkinson, [place] sent at 01/15/2020  6:47 AM EDT ----- No tears of the achilles. Only tendinitis. Schedule her with PT please.

## 2020-01-22 NOTE — Telephone Encounter (Signed)
Left message informing pt, we had spoken on the 12/20/2019 and discussed the MRI results and Dr. Geryl Rankins recommendation for PT and if she was calling to schedule, please call again and let me know, I will start the process.

## 2020-01-22 NOTE — Telephone Encounter (Signed)
Pt states she is calling for the MRI results.

## 2020-01-23 NOTE — Telephone Encounter (Signed)
Mailed letter informing pt of Dr. Geryl Rankins review of results and order.

## 2020-01-23 NOTE — Telephone Encounter (Signed)
Delivered rx to [facility].

## 2020-01-23 NOTE — Addendum Note (Signed)
Addended by: Alphia Kava D on: 01/23/2020 12:59 PM   Modules accepted: Orders

## 2020-01-23 NOTE — Telephone Encounter (Signed)
Pt called for results.

## 2020-01-29 ENCOUNTER — Telehealth: Payer: Self-pay | Admitting: Podiatry

## 2020-01-29 NOTE — Telephone Encounter (Signed)
Pt called and would like a call back and any advice on what to do for foot pain till her appt in june

## 2020-01-29 NOTE — Telephone Encounter (Signed)
I informed pt she should continue the ordered PT, and for episodes of pain and after PT, rest, ice 15-20 minutes 3-4 times a day protecting the skin from the ice with a light cloth, and if continuing to have pain call our office to check for earlier appt than she is currently scheduled.

## 2020-01-30 ENCOUNTER — Other Ambulatory Visit: Payer: Self-pay

## 2020-01-30 ENCOUNTER — Ambulatory Visit: Payer: BC Managed Care – PPO | Admitting: Podiatry

## 2020-01-30 ENCOUNTER — Ambulatory Visit (INDEPENDENT_AMBULATORY_CARE_PROVIDER_SITE_OTHER): Payer: BC Managed Care – PPO

## 2020-01-30 DIAGNOSIS — M778 Other enthesopathies, not elsewhere classified: Secondary | ICD-10-CM

## 2020-01-30 MED ORDER — METHYLPREDNISOLONE 4 MG PO TBPK: ORAL_TABLET | ORAL | 0 refills | Status: DC

## 2020-01-30 NOTE — Progress Notes (Signed)
She presents today for chief complaint of pain to her left foot right between her second and third toes plantarly.  States that she went for very long walk and then the next day she had pain in that area with throbbing she states is been about 2 weeks now.  She has been trying ibuprofen.  Objective: Vital signs are stable she is alert oriented x3 she has pain on end range of motion of the second and third metatarsophalangeal joints of the left foot with swelling plantarly.  Radiographs taken today do not demonstrate any stress fractures osseously mature individual with digital deformities.  Assessment: Capsulitis hammertoe deformities #2 #3 of the left foot.  Plan: Discussed etiology pathology conservative surgical therapies at this point I injected 10 mg of Kenalog 5 mg Marcaine to the interdigital space of the second and third metatarsophalangeal joints.  Also started her on a Medrol Dosepak.

## 2020-03-05 ENCOUNTER — Ambulatory Visit: Payer: BC Managed Care – PPO | Admitting: Podiatry

## 2020-03-12 ENCOUNTER — Ambulatory Visit: Payer: BC Managed Care – PPO | Admitting: Podiatry

## 2020-06-02 ENCOUNTER — Ambulatory Visit: Payer: BC Managed Care – PPO | Admitting: Podiatry

## 2020-06-02 ENCOUNTER — Other Ambulatory Visit: Payer: Self-pay

## 2020-06-02 VITALS — Ht 67.0 in | Wt 247.0 lb

## 2020-06-02 DIAGNOSIS — M2012 Hallux valgus (acquired), left foot: Secondary | ICD-10-CM | POA: Diagnosis not present

## 2020-06-02 DIAGNOSIS — M2042 Other hammer toe(s) (acquired), left foot: Secondary | ICD-10-CM | POA: Diagnosis not present

## 2020-06-02 DIAGNOSIS — M7752 Other enthesopathy of left foot: Secondary | ICD-10-CM

## 2020-06-02 DIAGNOSIS — M21862 Other specified acquired deformities of left lower leg: Secondary | ICD-10-CM

## 2020-06-02 DIAGNOSIS — M21612 Bunion of left foot: Secondary | ICD-10-CM

## 2020-06-02 DIAGNOSIS — L600 Ingrowing nail: Secondary | ICD-10-CM | POA: Diagnosis not present

## 2020-06-02 DIAGNOSIS — M216X2 Other acquired deformities of left foot: Secondary | ICD-10-CM

## 2020-06-02 NOTE — Progress Notes (Signed)
  Subjective:  Patient ID: Tiffany Mullins, female    DOB: 1965-11-19,  MRN: [id number]  Chief Complaint  Patient presents with  . Foot Pain    pain in toes of left foot    54 y.o. female presents with the above complaint. History confirmed with patient.  She has pain in the second and third toes, most severely the third toe on the left foot.  The big toe and second toe have been drifting towards the outside of the foot while the third toe is curling under and in and rubs against the other toe.  Objective:  Physical Exam: warm, good capillary refill, no trophic changes or ulcerative lesions, normal DP and PT pulses and normal sensory exam. Left Foot: Moderate to severe pes planus with digital contractures and hallux abductus interphalangeus and a mild bunion noted.  Mild pain on palpation to the second interspace with painful radiation into the digits.  Pain with lateral compression.   Radiographs: X-ray of the left foot: Moderate to severe pes planus with moderate to severe metatarsus adductus deformity noted with lateral deviation of the first, second, third, fourth proximal phalanges and adductovarus contracture of the third and fourth digits medially. Assessment:   1. Hallux valgus with bunions, left   2. Hammertoe of left foot   3. Gastrocnemius equinus of left lower extremity   4. Ingrowing right great toenail   5. Bursitis of left foot      Plan:  Patient was evaluated and treated and all questions answered.  -Today most of her pain was centered about the second interspace and the digital deformities.  She had some symptoms of a small neuroma or possibly a burst of the second interspace.  I recommend that we inject this with a corticosteroid to reduce the inflammation in the area.  Following sterile prep with alcohol and using ethyl chloride topical anesthetic, the left second interspace was injected with 0.5 cc of 2% Xylocaine plain, 0.5 cc 0.5% Marcaine plain, 2 mg  dexamethasone phosphate, and 10 mg of Kenalog.  She tolerated the procedure well.  -Discussed with her that her deformities are likely secondary and sequela of her significant pes planus and metatarsus adductus deformities.  She stated that the pain has been continually getting worse and the position of the toes really bothers her now.  I discussed with her that she has a significant form that would likely require reconstruction of the metatarsal adductus deformity to truly address the deformity.  We could consider angular correction of the digital deformities alone and release of the contractures, although I think that they are likely to recur given her severe metatarsus adductus.  She will consider this and we will discuss again at her next visit.   Return in about 1 month (around 07/02/2020) for re-check bunion and hammertoes.

## 2020-06-30 ENCOUNTER — Encounter: Payer: Self-pay | Admitting: Podiatry

## 2020-06-30 ENCOUNTER — Other Ambulatory Visit: Payer: Self-pay

## 2020-06-30 ENCOUNTER — Ambulatory Visit: Payer: BC Managed Care – PPO | Admitting: Podiatry

## 2020-06-30 DIAGNOSIS — M778 Other enthesopathies, not elsewhere classified: Secondary | ICD-10-CM | POA: Diagnosis not present

## 2020-06-30 DIAGNOSIS — M2042 Other hammer toe(s) (acquired), left foot: Secondary | ICD-10-CM

## 2020-06-30 DIAGNOSIS — M2012 Hallux valgus (acquired), left foot: Secondary | ICD-10-CM | POA: Diagnosis not present

## 2020-06-30 DIAGNOSIS — M21612 Bunion of left foot: Secondary | ICD-10-CM

## 2020-06-30 NOTE — Progress Notes (Signed)
She presents today complaining of pain to the third metatarsophalangeal joint of the left foot.  Would like to talk about surgery.  Objective: Vital signs are stable alert oriented x3 has painful bunion deformities as well as painful hammertoe deformity with dislocation.  Previous MRI was only of the rear foot not forefoot.  Radiographs reviewed once again today does demonstrate dislocation of the third toe at the level of the metatarsophalangeal joint and metatarsus abductus.  Assessment: Pain in limb secondary to bunion deformity and hammertoe deformity third left.  Plan: At this point I highly recommended that she have an MRI of the forefoot left.  She states that she can no longer afford this and says she is going to opt for an injection today which I provided her 10 mg Kenalog 5 mg Marcaine to the point of maximal tenderness.  She tolerated the procedure well without complications.  I also recommended new orthotics for her.  Recommend she call and check on her deductible.

## 2020-10-08 ENCOUNTER — Other Ambulatory Visit: Payer: Self-pay | Admitting: Family Medicine

## 2020-10-08 DIAGNOSIS — Z1231 Encounter for screening mammogram for malignant neoplasm of breast: Secondary | ICD-10-CM

## 2020-10-14 ENCOUNTER — Telehealth: Payer: Self-pay | Admitting: *Deleted

## 2020-10-14 DIAGNOSIS — M778 Other enthesopathies, not elsewhere classified: Secondary | ICD-10-CM

## 2020-10-14 DIAGNOSIS — S99922A Unspecified injury of left foot, initial encounter: Secondary | ICD-10-CM

## 2020-10-14 NOTE — Telephone Encounter (Signed)
Patient called is ready to get approval and schedule  MRI. Please contact.

## 2020-10-15 NOTE — Telephone Encounter (Signed)
Yes capsulitis and and a plantar plate tear.

## 2020-10-16 NOTE — Telephone Encounter (Signed)
Orders were placed for MRI left foot - evaluate capsulitis and plantar plate tear left - facility will contact patient once insurance approves.

## 2020-11-03 ENCOUNTER — Other Ambulatory Visit: Payer: BC Managed Care – PPO

## 2020-11-20 ENCOUNTER — Ambulatory Visit: Payer: BC Managed Care – PPO

## 2020-12-20 ENCOUNTER — Telehealth: Payer: Self-pay | Admitting: Sports Medicine

## 2020-12-20 NOTE — Telephone Encounter (Signed)
Patient called answering service stating that her left foot is painful can barely put pressure on it.  Patient denies any trauma or injury or significant redness or warmth but does admit some swelling and cramping pain to the lateral side of the left foot.  Pain is worsened with walking.  I advised patient to continue with rest ice elevation and to use compression/Ace wrap as directed.  I also advised patient to continue to use her walking boot that she has and to limit pressure on her left foot until she can follow-up in the office to be reevaluated.  I also offered patient a prescription for oral steroid however patient declined at this time so I advised her to continue with Tylenol and Motrin until she can be seen in office by Dr. Al Corpus.  Patient reports that she will call office in the morning for an appointment.  -Dr. Marylene Land

## 2020-12-22 ENCOUNTER — Ambulatory Visit (INDEPENDENT_AMBULATORY_CARE_PROVIDER_SITE_OTHER): Payer: BC Managed Care – PPO | Admitting: Podiatry

## 2020-12-22 ENCOUNTER — Ambulatory Visit (INDEPENDENT_AMBULATORY_CARE_PROVIDER_SITE_OTHER): Payer: BC Managed Care – PPO

## 2020-12-22 ENCOUNTER — Other Ambulatory Visit: Payer: Self-pay

## 2020-12-22 DIAGNOSIS — M779 Enthesopathy, unspecified: Secondary | ICD-10-CM | POA: Diagnosis not present

## 2020-12-22 DIAGNOSIS — M79672 Pain in left foot: Secondary | ICD-10-CM

## 2020-12-22 DIAGNOSIS — M778 Other enthesopathies, not elsewhere classified: Secondary | ICD-10-CM

## 2020-12-22 DIAGNOSIS — T148XXA Other injury of unspecified body region, initial encounter: Secondary | ICD-10-CM

## 2020-12-22 MED ORDER — METHYLPREDNISOLONE 4 MG PO TBPK: ORAL_TABLET | ORAL | 0 refills | Status: DC

## 2020-12-22 NOTE — Telephone Encounter (Signed)
She was seen in the office today.

## 2020-12-24 NOTE — Progress Notes (Signed)
Subjective: 55 year old female presents the office today for concerns of left foot pain.  She states that she is having cramping on the lateral aspect the foot that she points to.  She states that she been having pain to the foot however over the weekend it seemed to worsen.  She has been treated by Dr. Al Corpus for her foot pain previously.  She denies any specific injury or trauma over the weekend when the symptoms worsen.  No increase in swelling. Denies any systemic complaints such as fevers, chills, nausea, vomiting. No acute changes since last appointment, and no other complaints at this time.   Objective: AAO x3, NAD DP/PT pulses palpable bilaterally, CRT less than 3 seconds The majority discomfort to the lateral aspect left foot today.  This is along the course of the peroneal tendon there is localized edema but there is no erythema or warmth.  The peroneal tendon appears to be intact.  On discomfort on the course the ATFL.  No significant ankle instability is noted today.  There is no pain with ankle or subtalar range of motion.  There is no significant tenderness palpation on the Achilles tendon.  No pain on the tibia, fibula or talus. MMT 5/5.  Discomfort with inversion of the foot.  No pain with calf compression, swelling, warmth, erythema  Assessment: Worsening left foot pain, tendinitis; chronic ATFL tear  Plan: -All treatment options discussed with the patient including all alternatives, risks, complications.  -Repeat x-rays obtained and reviewed.  There is arthritic changes present calcaneocuboid joint versus periosteal reaction.  Digit deformities are present. -Given her symptoms recommend to immobilize in the cam boot.  She has this already at home. -Medrol Dosepak. -She had an MRI of her foot ordered but she never had this done.  We will follow up on this as well. -Ice and elevate -Patient encouraged to call the office with any questions, concerns, change in symptoms.   She will  follow-up with Dr. Al Corpus the next 3 to 4 weeks or sooner if any issues are to arise.  Vivi Barrack DPM

## 2020-12-30 ENCOUNTER — Telehealth: Payer: Self-pay | Admitting: *Deleted

## 2020-12-30 NOTE — Telephone Encounter (Signed)
Patient has an appointment on 01-15-2021. Tiffany Mullins

## 2021-01-07 ENCOUNTER — Ambulatory Visit
Admission: RE | Admit: 2021-01-07 | Discharge: 2021-01-07 | Disposition: A | Discharge status: Home / Self Care | Payer: Self-pay | Source: Ambulatory Visit | Attending: Family Medicine | Admitting: Family Medicine

## 2021-01-07 DIAGNOSIS — Z1231 Encounter for screening mammogram for malignant neoplasm of breast: Secondary | ICD-10-CM

## 2021-01-07 IMAGING — MG DIGITAL SCREENING BILAT W/ CAD
4 series · 4 of 4 positions shown · non-contrast
Comparison: Previous exam(s).

CLINICAL DATA: Screening.

EXAM:
DIGITAL SCREENING BILATERAL MAMMOGRAM WITH CAD
TECHNIQUE: Bilateral screening digital craniocaudal and mediolateral oblique
mammograms were obtained. The images were evaluated with
computer-aided detection.

[L MLO]
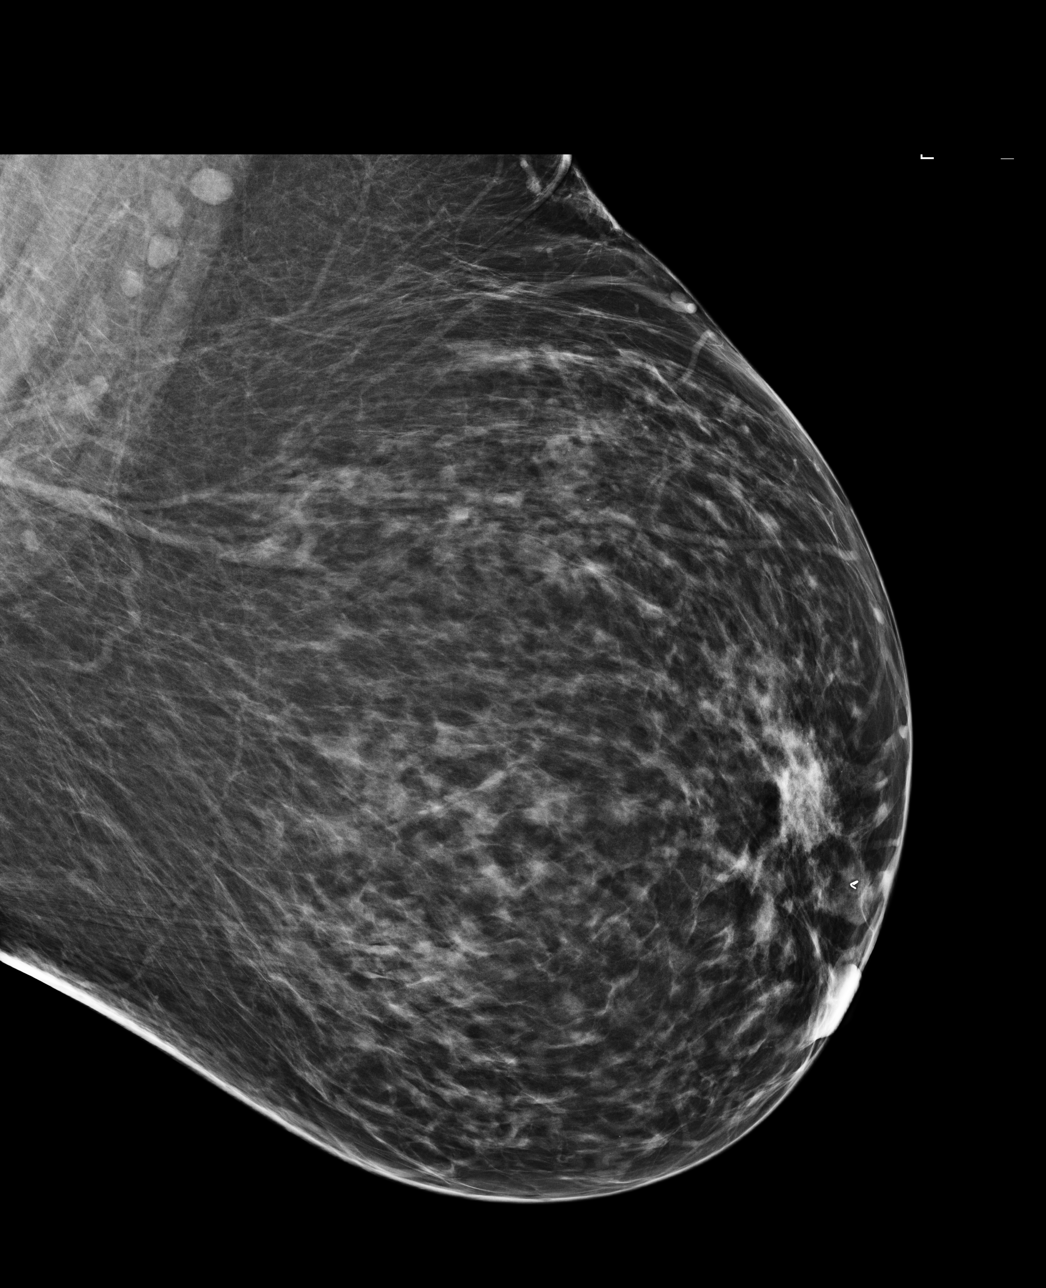

[R MLO]
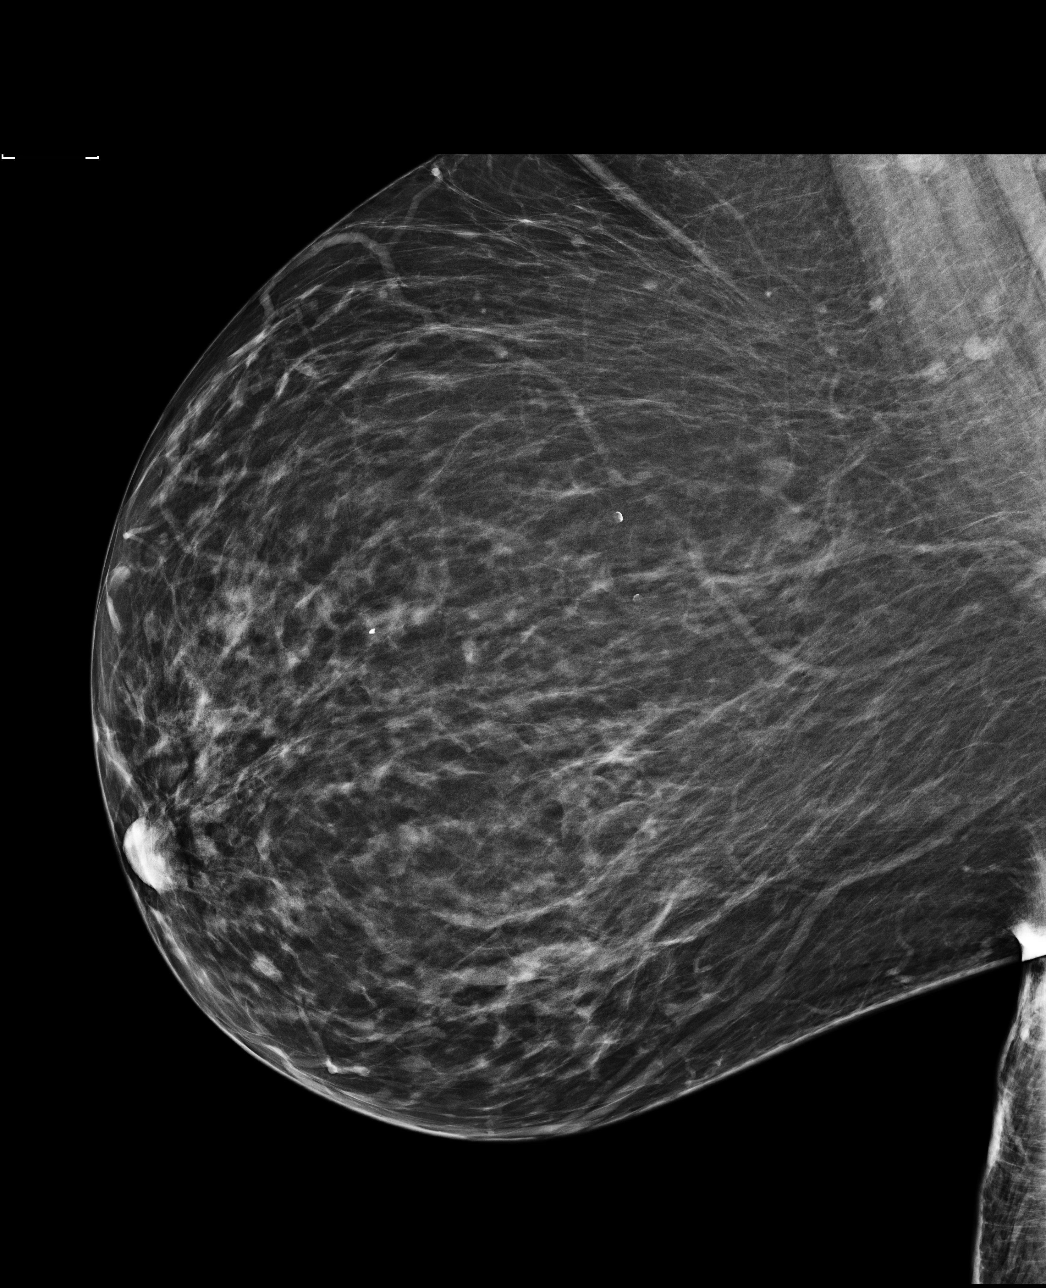

[R CC]
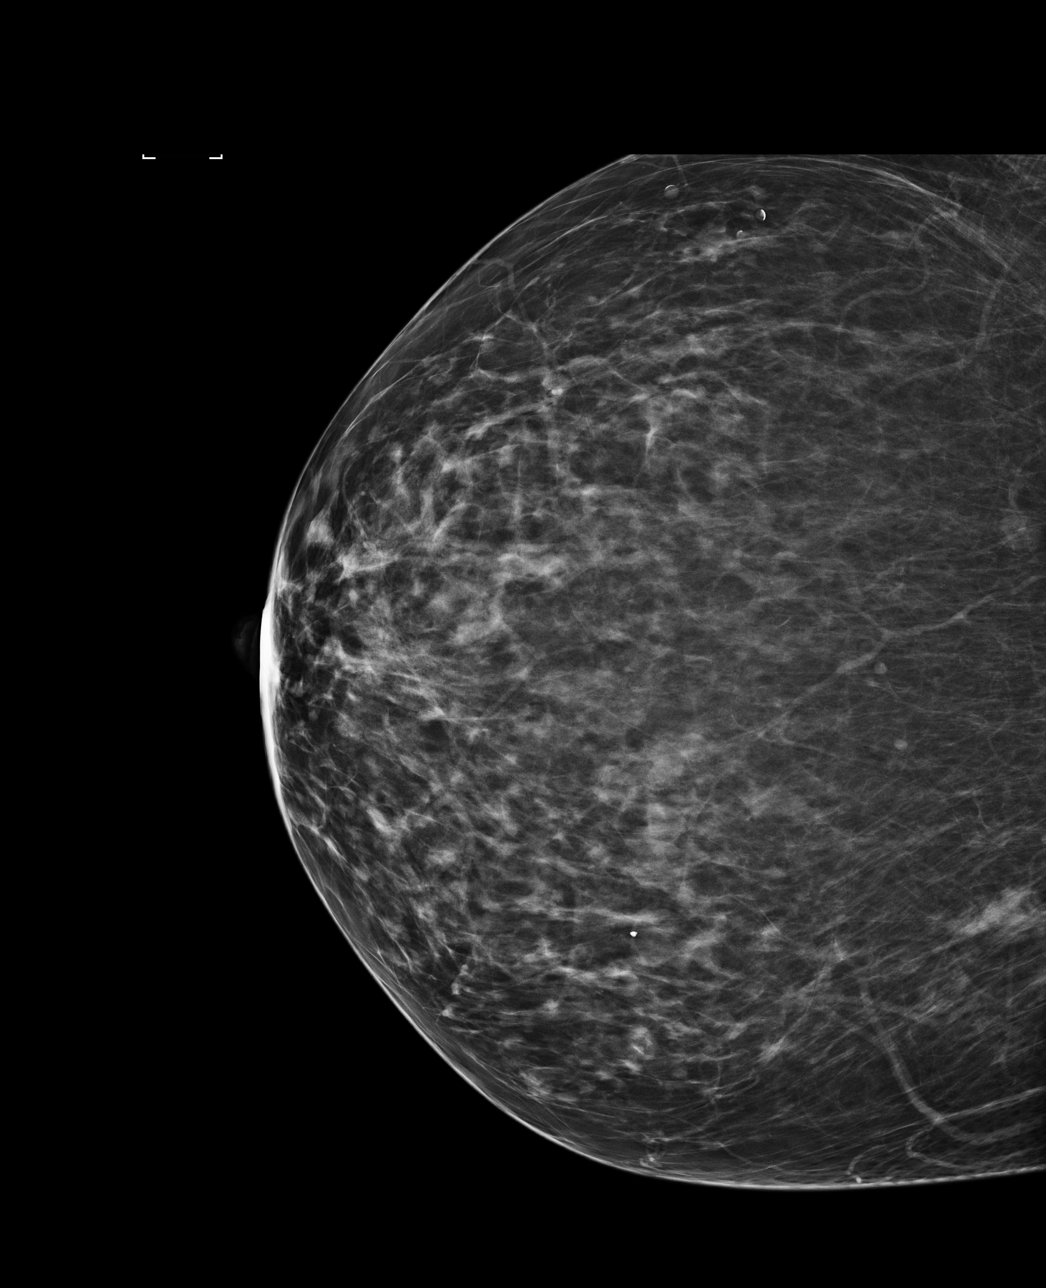

[L CC]
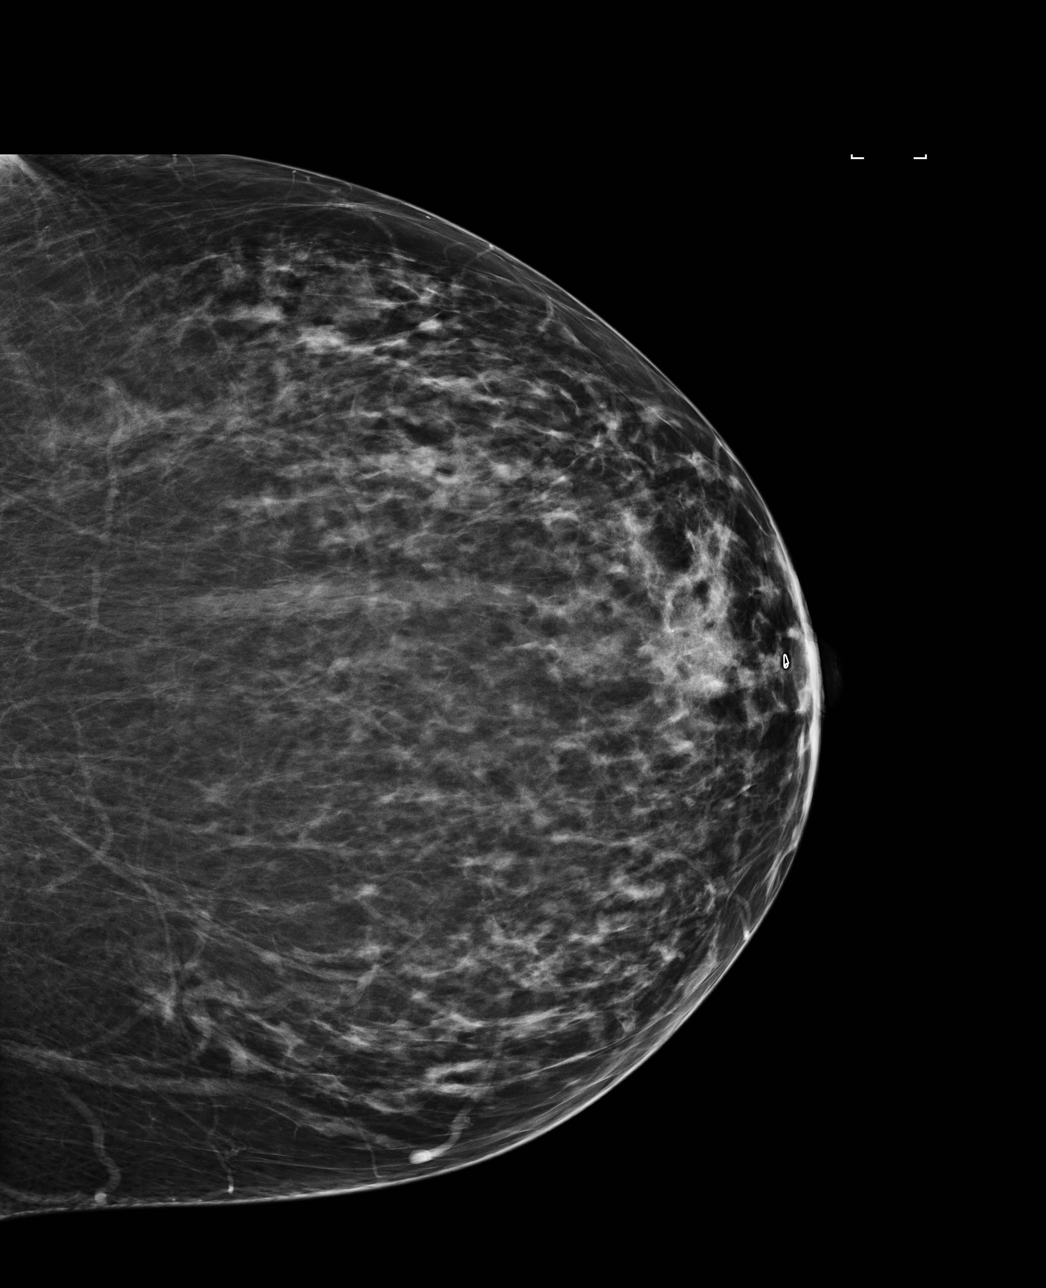

[4 of 4 positions shown; findings below may reference images not displayed]

ACR Breast Density Category b: There are scattered areas of
fibroglandular density.
FINDINGS: There are no findings suspicious for malignancy. The images were
evaluated with computer-aided detection.
IMPRESSION: No mammographic evidence of malignancy. A result letter of this
screening mammogram will be mailed directly to the patient.

RECOMMENDATION:
Screening mammogram in one year. (Code:[IA])

BI-RADS CATEGORY  1: Negative.

## 2021-01-12 ENCOUNTER — Other Ambulatory Visit: Payer: Self-pay

## 2021-01-12 ENCOUNTER — Ambulatory Visit: Payer: BC Managed Care – PPO | Admitting: Podiatry

## 2021-01-12 DIAGNOSIS — M779 Enthesopathy, unspecified: Secondary | ICD-10-CM

## 2021-01-12 DIAGNOSIS — T148XXA Other injury of unspecified body region, initial encounter: Secondary | ICD-10-CM | POA: Diagnosis not present

## 2021-01-13 NOTE — Progress Notes (Signed)
She presents today states that her foot is feeling a little bit better but is still very painful when she stretches.  States that she gets a lot of cramping on the side of the foot she is scheduled for her MRI this Friday.  She is still concerned about the toes to crossover in the front and that are painful.  Objective: Vital signs are stable alert and oriented x3.  Pulses are palpable.  She has tenderness along the fifth metatarsal base.  She also has tenderness in the forefoot with a third hammertoe deformity and a lateral deviation of her second toe.  These are still exquisitely tender for her.  Assessment: Probable rupture of the plantar plate second and third toes and a possible compensatory peroneal brevis tendinitis.  Plan: Await findings of MRI to be taken this Friday.

## 2021-01-15 ENCOUNTER — Ambulatory Visit
Admission: RE | Admit: 2021-01-15 | Discharge: 2021-01-15 | Disposition: A | Discharge status: Home / Self Care | Payer: BC Managed Care – PPO | Source: Ambulatory Visit | Attending: Podiatry | Admitting: Podiatry

## 2021-01-15 ENCOUNTER — Other Ambulatory Visit: Payer: Self-pay

## 2021-01-15 DIAGNOSIS — S99922A Unspecified injury of left foot, initial encounter: Secondary | ICD-10-CM

## 2021-01-15 DIAGNOSIS — M778 Other enthesopathies, not elsewhere classified: Secondary | ICD-10-CM

## 2021-01-15 IMAGING — MR MR FOOT*L* W/O CM
4 of 5 series · 20 of 40 positions shown · non-contrast
Comparison: Radiographs [DATE] and [DATE].

CLINICAL DATA: Dorsal forefoot pain for more than 1 year. No acute
injury or prior relevant surgery. Plantar plate injury suspected.

EXAM:
MRI OF THE LEFT FOOT WITHOUT CONTRAST
TECHNIQUE: Multiplanar, multisequence MR imaging of the left forefoot was
performed. No intravenous contrast was administered.

[Series 4: T1 · coronal · 3.0mm · 0.23mm/px · 3 of 55 slices shown (1 of 2)]
[im 10/55]
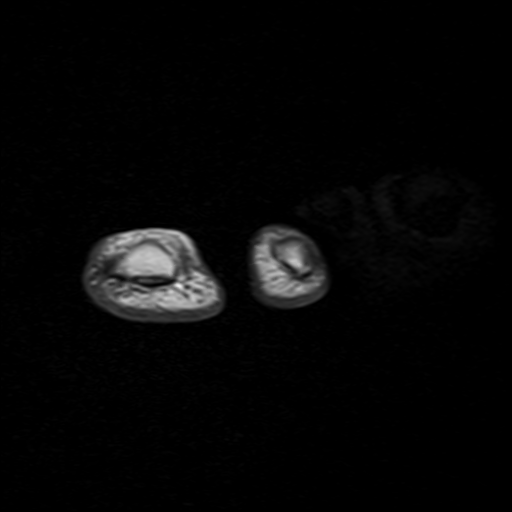
[im 30/55]
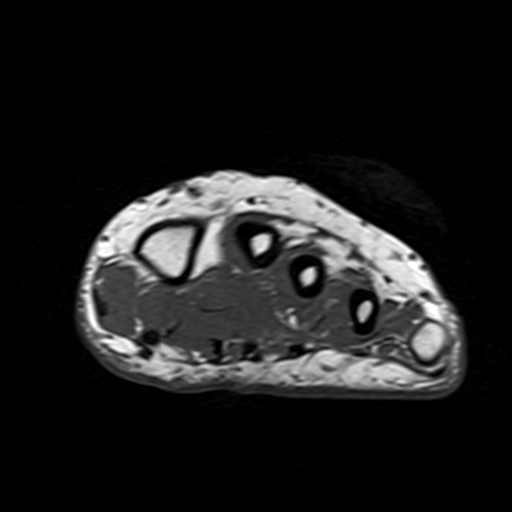
[im 50/55]
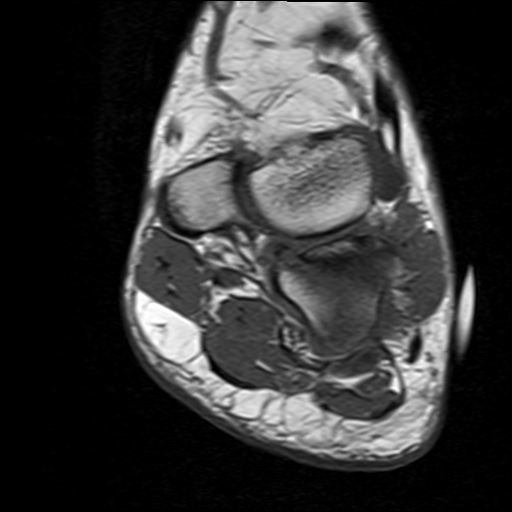

[Series 5: T2 fat-sat · coronal · 3.0mm · 0.23mm/px · 9 of 55 slices shown (1 of 2)]
[im 1/55]
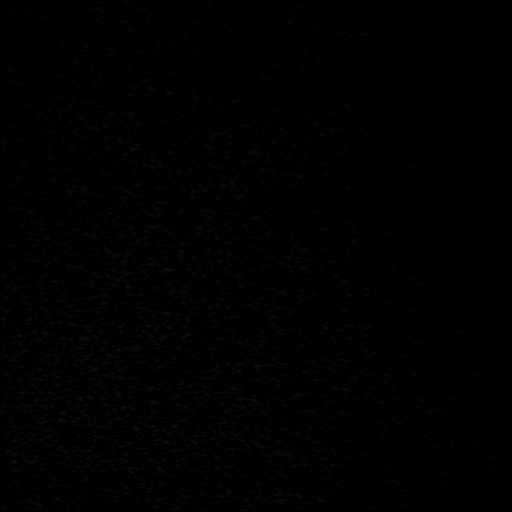
[im 10/55]
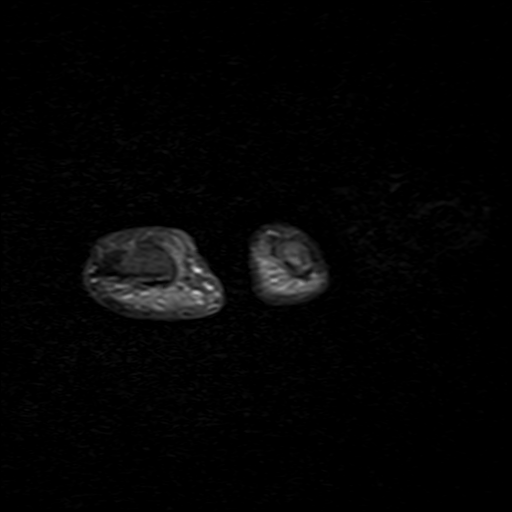
[im 15/55]
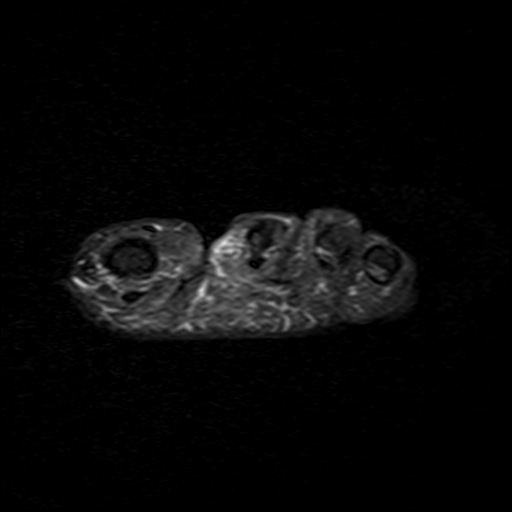
[im 25/55]
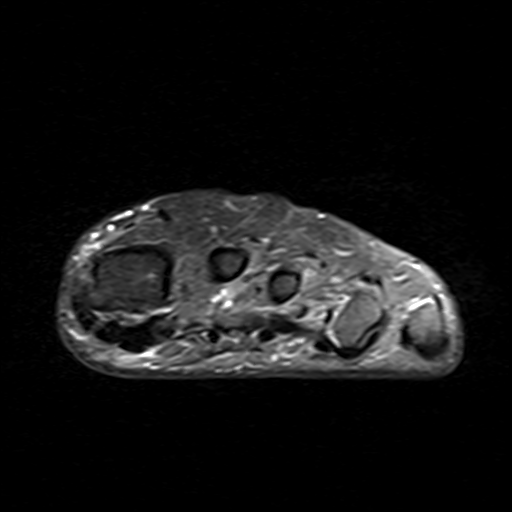
[im 30/55]
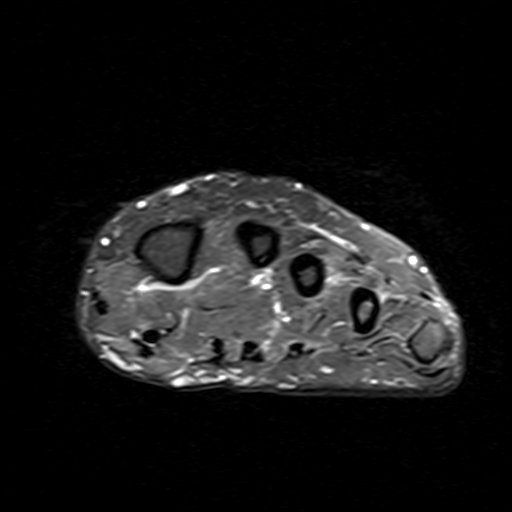
[im 40/55]
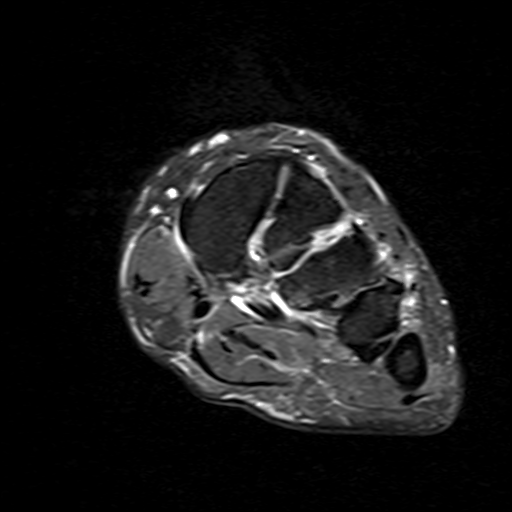
[im 45/55]
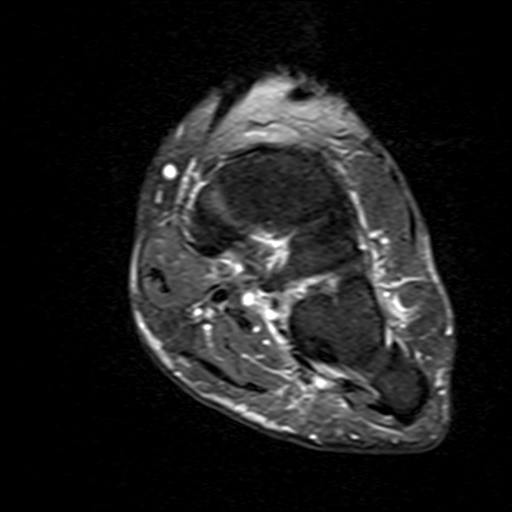
[im 50/55]
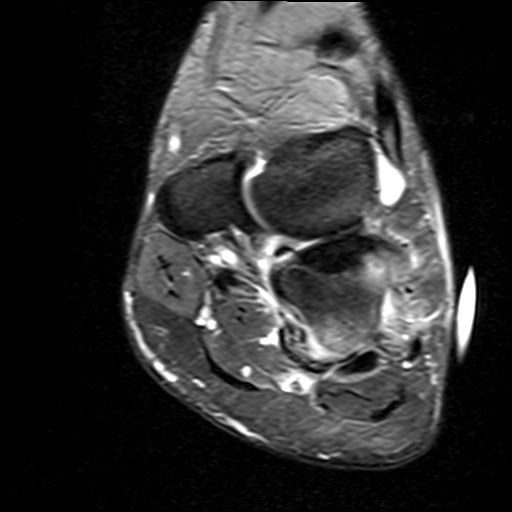
[im 55/55]
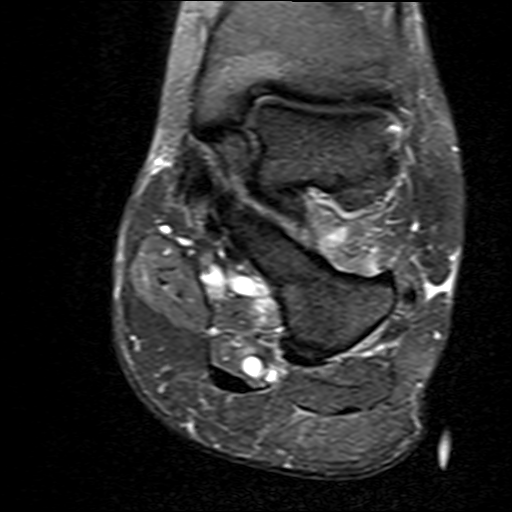

[Series 6: T2 fat-sat · axial · 3.0mm · 0.41mm/px · z∈[-52,+42]mm · 5 of 26 slices shown (2 of 2)]
[im 1/26]
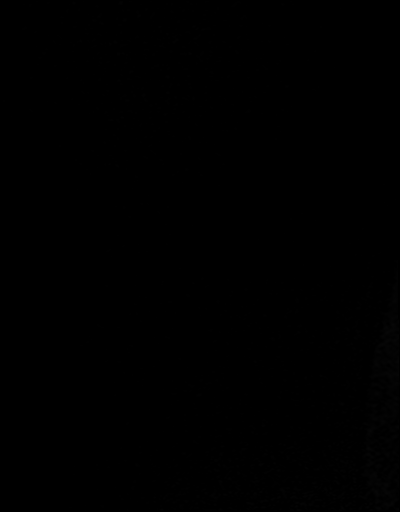
[im 7/26]
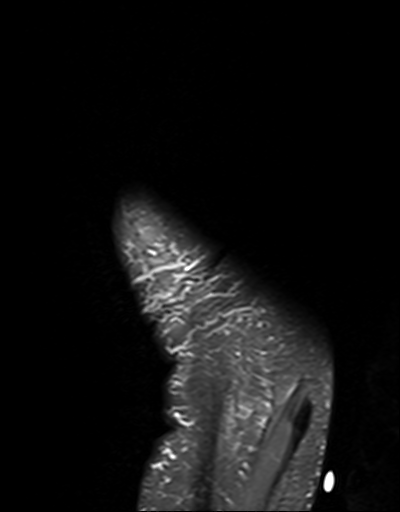
[im 13/26]
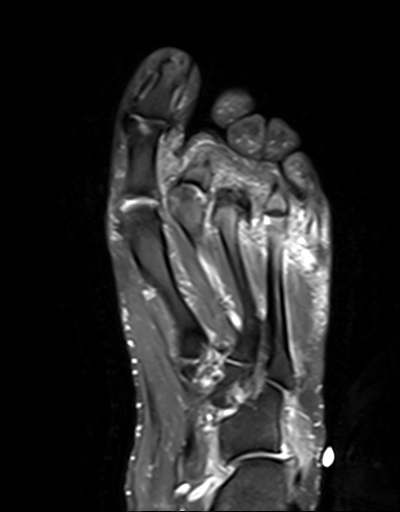
[im 19/26]
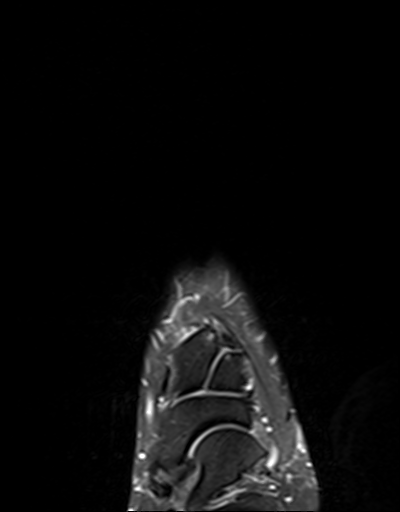
[im 26/26]
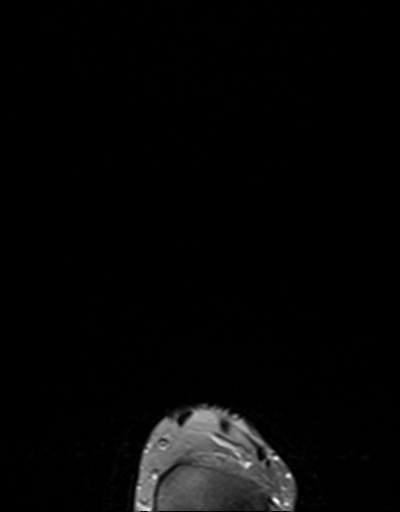

[Series 7: T1 · axial · 3.0mm · 0.41mm/px · z∈[-52,+42]mm · 3 of 26 slices shown (2 of 2)]
[im 1/26]
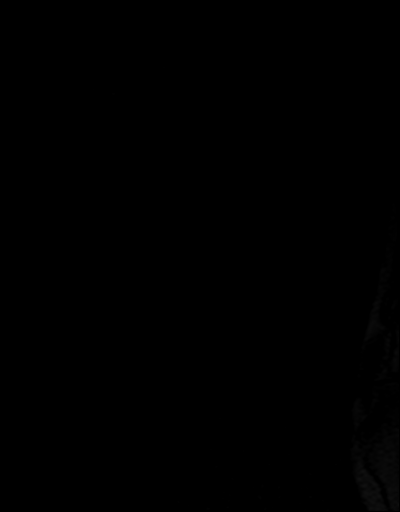
[im 13/26]
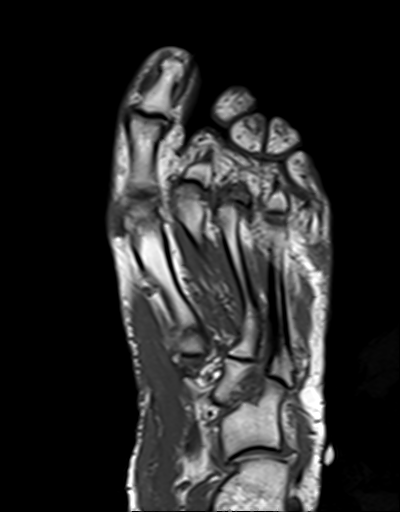
[im 26/26]
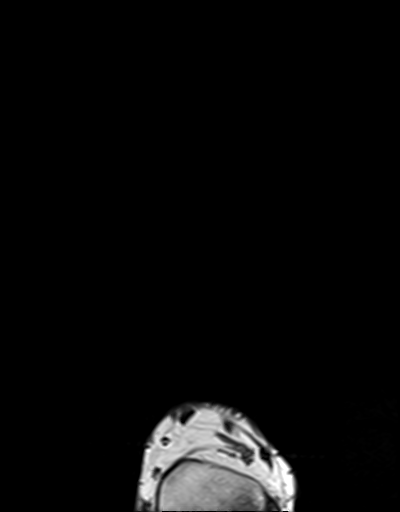

[20 of 40 positions shown; findings below may reference images not displayed]

FINDINGS: Bones/Joint/Cartilage

There is persistent dorsal and lateral dislocation at the 3rd
metatarsophalangeal joint without evidence of associated avulsion
fracture or significant bone marrow edema. There is surrounding soft
tissue edema, but no significant joint effusion.

There is a small effusion of the 1st metatarsophalangeal joint. No
other dislocation or acute osseous findings. The alignment is normal
at the Lisfranc joint.

Ligaments

Intact Lisfranc ligament. The plantar plate of the 3rd
metatarsophalangeal joint appears discontinuous medially on the
axial images and is likely torn.

Muscles and Tendons

The flexor and extensor tendons appear intact without significant
tenosynovitis. No focal muscular abnormality.

Soft tissues

As above, probable injury of the plantar plate of the 3rd MTP joint
with surrounding soft tissue edema. No focal fluid collection or
foreign body.
IMPRESSION: 1. Persistent dorsal and lateral dislocation at the 3rd
metatarsophalangeal joint with probable underlying rupture of the
plantar plate medially near the metatarsal attachment.
2. No evidence of acute fracture.
3. The forefoot tendons appear intact.

## 2021-01-18 ENCOUNTER — Telehealth: Payer: Self-pay | Admitting: *Deleted

## 2021-01-18 NOTE — Telephone Encounter (Signed)
L/M for patient with findings and recommendations of MRI - advised to call back and set up consult appt with Dr. Lilian Kapur if she would like to proceed with surgery.

## 2021-01-18 NOTE — Telephone Encounter (Signed)
-----   Message from Elinor Parkinson, [place] sent at 01/18/2021  7:01 AM EDT ----- Morrie Sheldon let her know that I would like for her to follow-up with Dr. Lilian Kapur for surgical plantar plate repair of the second and third metatarsophalangeal joints.  He is the only 1 that I know of in our office that actually repairs the plantar plate.  I think this will be necessary with her due to her lateral deviation of the second toe.

## 2021-01-28 ENCOUNTER — Other Ambulatory Visit: Payer: Self-pay

## 2021-01-28 ENCOUNTER — Ambulatory Visit (INDEPENDENT_AMBULATORY_CARE_PROVIDER_SITE_OTHER): Payer: BC Managed Care – PPO | Admitting: Podiatry

## 2021-01-28 DIAGNOSIS — M2012 Hallux valgus (acquired), left foot: Secondary | ICD-10-CM

## 2021-01-28 DIAGNOSIS — S99922A Unspecified injury of left foot, initial encounter: Secondary | ICD-10-CM

## 2021-01-28 DIAGNOSIS — Q66222 Congenital metatarsus adductus, left foot: Secondary | ICD-10-CM | POA: Diagnosis not present

## 2021-01-28 DIAGNOSIS — M21612 Bunion of left foot: Secondary | ICD-10-CM

## 2021-01-28 DIAGNOSIS — M2042 Other hammer toe(s) (acquired), left foot: Secondary | ICD-10-CM | POA: Diagnosis not present

## 2021-02-01 ENCOUNTER — Encounter: Payer: Self-pay | Admitting: Podiatry

## 2021-02-01 NOTE — Progress Notes (Signed)
Subjective:  Patient ID: Tiffany Mullins, female    DOB: 06-18-1966,  MRN: [id number]  Chief Complaint  Patient presents with  . Bunions     sx consult plantar repair-left foot - review MRI    55 y.o. female presents with the above complaint. History confirmed with patient.  She is referred to me by Dr. Al Corpus.  She has had difficulty with this foot for some time.  She underwent MRI in April  Objective:  Physical Exam: warm, good capillary refill, no trophic changes or ulcerative lesions, normal DP and PT pulses and normal sensory exam. Left Foot: She has hallux valgus with hammertoe deformities the worst of which are the second and third toes, she is sharp pain on palpation of the plantar plate, mild pain on the base of the fifth metatarsal  Radiographs: X-ray of the left foot: She has metatarsal deformity with hallux valgus metatarsus primus varus, deviated hammertoe deformities 2,3,4  Study Result  Narrative & Impression  CLINICAL DATA:  Dorsal forefoot pain for more than 1 year. No acute injury or prior relevant surgery. Plantar plate injury suspected.  EXAM: MRI OF THE LEFT FOOT WITHOUT CONTRAST  TECHNIQUE: Multiplanar, multisequence MR imaging of the left forefoot was performed. No intravenous contrast was administered.  COMPARISON:  Radiographs 12/22/2020 and 01/30/2020.  FINDINGS: Bones/Joint/Cartilage  There is persistent dorsal and lateral dislocation at the 3rd metatarsophalangeal joint without evidence of associated avulsion fracture or significant bone marrow edema. There is surrounding soft tissue edema, but no significant joint effusion.  There is a small effusion of the 1st metatarsophalangeal joint. No other dislocation or acute osseous findings. The alignment is normal at the Lisfranc joint.  Ligaments  Intact Lisfranc ligament. The plantar plate of the 3rd metatarsophalangeal joint appears discontinuous medially on the axial images and  is likely torn.  Muscles and Tendons  The flexor and extensor tendons appear intact without significant tenosynovitis. No focal muscular abnormality.  Soft tissues  As above, probable injury of the plantar plate of the 3rd MTP joint with surrounding soft tissue edema. No focal fluid collection or foreign body.  IMPRESSION: 1. Persistent dorsal and lateral dislocation at the 3rd metatarsophalangeal joint with probable underlying rupture of the plantar plate medially near the metatarsal attachment. 2. No evidence of acute fracture. 3. The forefoot tendons appear intact.   Electronically Signed   By: Carey Bullocks M.D.   On: 01/15/2021 14:38    Assessment:   1. Injury of plantar plate of left foot, initial encounter   2. Metatarsus adductus of left foot   3. Hallux valgus with bunions, left   4. Hammertoe of left foot      Plan:  Patient was evaluated and treated and all questions answered.  Reviewed the radiographic and clinical findings with the patient in detail.  We discussed further treatment including surgical and nonsurgical treatment.  I think she would benefit best from surgical treatment at this point.  I reviewed with her that we could repair the plantar plates directly or indirectly along with percutaneous pinning to relocate the joints.  She does have risk factors for this recurring including her significant metatarsus adductus deformity as well as a lengthened second and third metatarsals because of the incompetence of the first ray.  We also discussed complete correction of the metatarsal adductus deformity to see if this alleviates and reduces her risk of recurrence and she will consider these options.  She will return in 4 weeks and plan for  a surgical date and procedure at that time.  Return in about 4 weeks (around 02/25/2021).

## 2021-03-01 ENCOUNTER — Ambulatory Visit: Payer: BC Managed Care – PPO | Admitting: Podiatry

## 2021-03-04 ENCOUNTER — Ambulatory Visit: Payer: BC Managed Care – PPO | Admitting: Cardiology

## 2021-03-04 ENCOUNTER — Encounter: Payer: Self-pay | Admitting: Cardiology

## 2021-03-04 ENCOUNTER — Other Ambulatory Visit: Payer: Self-pay

## 2021-03-04 VITALS — BP 128/90 | HR 69 | Ht 67.0 in | Wt 247.4 lb

## 2021-03-04 DIAGNOSIS — I10 Essential (primary) hypertension: Secondary | ICD-10-CM | POA: Diagnosis not present

## 2021-03-04 DIAGNOSIS — R079 Chest pain, unspecified: Secondary | ICD-10-CM

## 2021-03-04 NOTE — Progress Notes (Signed)
Cardiology CONSULT Note    Date:  03/04/2021   ID:  Tiffany Mullins, DOB March 15, 1966, MRN [id number]  PCP:  Darrow Bussing, MD  Cardiologist:  Armanda Magic, MD   Chief Complaint  Patient presents with   New Patient (Initial Visit)    Chest pain     History of Present Illness:  Tiffany Mullins is a 55 y.o. female who is being seen today for the evaluation of chest pain at the request of [name], [name], MD.  This is a 54yo AAF with a hx of GERD, morbid obesity and HTN who is referred for evaluation of chest pain.  She says that she has been having intermittent chest pain for several months and cannot discern if it is from her GERD.  She describes it mid sternal and slightly to the right that was pressure and only lasts a few seconds with pain around her shoulder blades and around her neck.  She has been under a lot of stress and thought it might be related.  She has had 2 episodes of pain where she also had left arm pain.  She denies any diaphoresis or nausea and no SOB with the episodes.  She smoked a few cigarettes for a month in the 1990's but otherwise no tobacco use but was exposed to second hand smoke as a child.   Her father has a hx of CAD with several MIs and CABG as well as CHF but she does not recall how old he was with his first MI.    Past Medical History:  Diagnosis Date   Allergic rhinitis    Carpal tunnel syndrome    GERD (gastroesophageal reflux disease)    Hypertension    Left sided sciatica    Morbid obesity (HCC)    Vitamin D deficiency     Past Surgical History:  Procedure Laterality Date   BREAST BIOPSY Left     Current Medications: Current Meds  Medication Sig   Cholecalciferol (VITAMIN D3) 1000 units CAPS Take by mouth.   ELDERBERRY PO Take by mouth daily. 2 teaspoons every morning   hydrochlorothiazide (HYDRODIURIL) 25 MG tablet Take 25 mg by mouth daily.   vitamin B-12 (CYANOCOBALAMIN) 500 MCG tablet Take 1,000 mcg by mouth daily.   vitamin C  (ASCORBIC ACID) 500 MG tablet Take 500 mg by mouth daily.    Allergies:   Ciprofloxacin and Penicillins   Social History   Socioeconomic History   Marital status: Single    Spouse name: Not on file   Number of children: Not on file   Years of education: Not on file   Highest education level: Not on file  Occupational History   Not on file  Tobacco Use   Smoking status: Never   Smokeless tobacco: Never  Substance and Sexual Activity   Alcohol use: Yes    Alcohol/week: 1.0 standard drink    Types: 1 Glasses of wine per week    Comment: rarely   Drug use: No   Sexual activity: Yes  Other Topics Concern   Not on file  Social History Narrative   Not on file   Social Determinants of Health   Financial Resource Strain: Not on file  Food Insecurity: Not on file  Transportation Needs: Not on file  Physical Activity: Not on file  Stress: Not on file  Social Connections: Not on file     Family History:  The patient's family history includes Breast cancer in her paternal  aunt.   ROS:   Please see the history of present illness.    ROS All other systems reviewed and are negative.  No flowsheet data found.   PHYSICAL EXAM:   VS:  BP 128/90   Pulse 69   Ht 5\' 7"  (1.702 m)   Wt 247 lb 6.4 oz (112.2 kg)   SpO2 97%   BMI 38.75 kg/m    GEN: Well nourished, well developed, in no acute distress  HEENT: normal  Neck: no JVD, carotid bruits, or masses Cardiac: RRR; no murmurs, rubs, or gallops,no edema.  Intact distal pulses bilaterally.  Respiratory:  clear to auscultation bilaterally, normal work of breathing GI: soft, nontender, nondistended, + BS MS: no deformity or atrophy  Skin: warm and dry, no rash Neuro:  Alert and Oriented x 3, Strength and sensation are intact Psych: euthymic mood, full affect  Wt Readings from Last 3 Encounters:  03/04/21 247 lb 6.4 oz (112.2 kg)  06/02/20 247 lb (112 kg)  12/07/17 252 lb (114.3 kg)      Studies/Labs Reviewed:   EKG:   EKG is ordered today.  The ekg ordered today demonstrates NSR with no ST changes  Recent Labs: No results found for requested labs within last 8760 hours.   Lipid Panel No results found for: CHOL, TRIG, HDL, CHOLHDL, VLDL, LDLCALC, LDLDIRECT   Additional studies/ records that were reviewed today include:  OV notes from PCP, EKG    ASSESSMENT:    1. Chest pain of uncertain etiology   2. Benign essential HTN      PLAN:  In order of problems listed above:  Chest pain -her symptoms are very atypical but she does have a fm hx of CAD as well as second hand smoke exposure and HTN and obesity -EKG is nonischemia -I will get an ETT to rule out ischemia -Shared Decision Making/Informed Consent25 The risks [chest pain, shortness of breath, cardiac arrhythmias, dizziness, blood pressure fluctuations, myocardial infarction, stroke/transient ischemic attack, nausea, vomiting, allergic reaction, radiation exposure, metallic taste sensation and life-threatening complications (estimated to be 1 in 10,000)], benefits (risk stratification, diagnosing coronary artery disease, treatment guidance) and alternatives of a nuclear stress test were discussed in detail with Ms. Eisenhower and she agrees to proceed -check coronary Ca score to assess future risk -check 2D echo to assess LVF  2.  HTN -BP borderline controlled on exam today -continue prescription drug management with HCTZ 25mg  daily    Time Spent: 25 minutes total time of encounter, including 15 minutes spent in face-to-face patient care on the date of this encounter. This time includes coordination of care and counseling regarding above mentioned problem list. Remainder of non-face-to-face time involved reviewing chart documents/testing relevant to the patient encounter and documentation in the medical record. I have independently reviewed documentation from referring provider  Medication Adjustments/Labs and Tests Ordered: Current  medicines are reviewed at length with the patient today.  Concerns regarding medicines are outlined above.  Medication changes, Labs and Tests ordered today are listed in the Patient Instructions below.  There are no Patient Instructions on file for this visit.   Signed, 12/09/17, MD  03/04/2021 8:56 AM    [facility] [address] [place], [place], [place]  [place] Phone: [phone]; Fax: [phone]

## 2021-03-04 NOTE — Addendum Note (Signed)
Addended by: Armanda Magic R on: 03/04/2021 12:38 PM   Modules accepted: Orders

## 2021-03-04 NOTE — Addendum Note (Signed)
Addended by: Theresia Majors on: 03/04/2021 09:05 AM   Modules accepted: Orders

## 2021-03-04 NOTE — Patient Instructions (Signed)
Medication Instructions:  Your physician recommends that you continue on your current medications as directed. Please refer to the Current Medication list given to you today.  *If you need a refill on your cardiac medications before your next appointment, please call your pharmacy*  Testing/Procedures: Your physician has requested that you have an exercise tolerance test. For further information please visit [email or web address] Please also follow instruction sheet, as given.  Your physician has requested that you have an echocardiogram. Echocardiography is a painless test that uses sound waves to create images of your heart. It provides your doctor with information about the size and shape of your heart and how well your heart's chambers and valves are working. This procedure takes approximately one hour. There are no restrictions for this procedure.  Your physician has requested that you have a calcium score CT scan.    Follow-Up: At [facility], you and your health needs are our priority.  As part of our continuing mission to provide you with exceptional heart care, we have created designated Provider Care Teams.  These Care Teams include your primary Cardiologist (physician) and Advanced Practice Providers (APPs -  Physician Assistants and Nurse Practitioners) who all work together to provide you with the care you need, when you need it.  Follow up with Dr. Mayford Knife as needed based on results of testing.

## 2021-04-01 ENCOUNTER — Ambulatory Visit (HOSPITAL_COMMUNITY): Payer: BC Managed Care – PPO | Attending: Cardiovascular Disease

## 2021-04-01 ENCOUNTER — Other Ambulatory Visit: Payer: Self-pay

## 2021-04-01 ENCOUNTER — Ambulatory Visit (INDEPENDENT_AMBULATORY_CARE_PROVIDER_SITE_OTHER)
Admission: RE | Admit: 2021-04-01 | Discharge: 2021-04-01 | Disposition: A | Discharge status: Home / Self Care | Payer: Self-pay | Source: Ambulatory Visit | Attending: Cardiology | Admitting: Cardiology

## 2021-04-01 ENCOUNTER — Ambulatory Visit (INDEPENDENT_AMBULATORY_CARE_PROVIDER_SITE_OTHER): Payer: BC Managed Care – PPO

## 2021-04-01 DIAGNOSIS — R079 Chest pain, unspecified: Secondary | ICD-10-CM

## 2021-04-01 LAB — EXERCISE TOLERANCE TEST
Estimated workload: 7 METS
Exercise duration (min): 6 min
Exercise duration (sec): 0 s
MPHR: 168 {beats}/min
Peak HR: 162 {beats}/min
Percent HR: 96 %
RPE: 17
Rest HR: 76 {beats}/min

## 2021-04-01 LAB — ECHOCARDIOGRAM COMPLETE
AR max vel: 3.65 cm2
AV Area VTI: 3.66 cm2
AV Area mean vel: 3.48 cm2
AV Mean grad: 3 mmHg
AV Peak grad: 4.5 mmHg
Ao pk vel: 1.06 m/s
Area-P 1/2: 5.38 cm2
S' Lateral: 3.3 cm

## 2021-04-02 ENCOUNTER — Telehealth: Payer: Self-pay

## 2021-04-02 DIAGNOSIS — R079 Chest pain, unspecified: Secondary | ICD-10-CM

## 2021-04-02 NOTE — Telephone Encounter (Signed)
The patient has been notified of the result and verbalized understanding.  All questions (if any) were answered. Theresia Majors, RN 04/02/2021 3:22 PM  Lab work has been scheduled.

## 2021-04-02 NOTE — Telephone Encounter (Signed)
-----   Message from Quintella Reichert, MD sent at 04/02/2021  1:58 PM EDT ----- Elevated coronary Ca score - please have her come in for FLP and ALT

## 2021-04-12 ENCOUNTER — Other Ambulatory Visit: Payer: Self-pay

## 2021-04-12 ENCOUNTER — Encounter: Payer: Self-pay | Admitting: Podiatry

## 2021-04-12 ENCOUNTER — Ambulatory Visit: Payer: BC Managed Care – PPO | Admitting: Podiatry

## 2021-04-12 DIAGNOSIS — R2689 Other abnormalities of gait and mobility: Secondary | ICD-10-CM | POA: Diagnosis not present

## 2021-04-12 DIAGNOSIS — S99922A Unspecified injury of left foot, initial encounter: Secondary | ICD-10-CM | POA: Diagnosis not present

## 2021-04-12 DIAGNOSIS — Q66222 Congenital metatarsus adductus, left foot: Secondary | ICD-10-CM

## 2021-04-12 NOTE — Progress Notes (Addendum)
Subjective:  Patient ID: Tiffany Mullins, female    DOB: Apr 06, 1966,  MRN: [id number]  Chief Complaint  Patient presents with   Foot Injury    Follow up left foot, discuss possible surgical correction    55 y.o. female presents with the above complaint. History confirmed with patient.  Returns for follow-up she is ready to schedule surgery.  Has been feeling somewhat better recently because she is not on her feet as much with school being out for summer but she knows it is going to get worse when she goes back to work this fall  Objective:  Physical Exam: warm, good capillary refill, no trophic changes or ulcerative lesions, normal DP and PT pulses and normal sensory exam. Left Foot: She has hallux valgus with hammertoe deformities the worst of which are the second and third toes, she is sharp pain on palpation of the plantar plate, mild pain on the base of the fifth metatarsal  Radiographs: X-ray of the left foot: She has metatarsal deformity with hallux valgus metatarsus primus varus, deviated hammertoe deformities 2,3,4  Study Result  Narrative & Impression  CLINICAL DATA:  Dorsal forefoot pain for more than 1 year. No acute injury or prior relevant surgery. Plantar plate injury suspected.   EXAM: MRI OF THE LEFT FOOT WITHOUT CONTRAST   TECHNIQUE: Multiplanar, multisequence MR imaging of the left forefoot was performed. No intravenous contrast was administered.   COMPARISON:  Radiographs 12/22/2020 and 01/30/2020.   FINDINGS: Bones/Joint/Cartilage   There is persistent dorsal and lateral dislocation at the 3rd metatarsophalangeal joint without evidence of associated avulsion fracture or significant bone marrow edema. There is surrounding soft tissue edema, but no significant joint effusion.   There is a small effusion of the 1st metatarsophalangeal joint. No other dislocation or acute osseous findings. The alignment is normal at the Lisfranc joint.   Ligaments    Intact Lisfranc ligament. The plantar plate of the 3rd metatarsophalangeal joint appears discontinuous medially on the axial images and is likely torn.   Muscles and Tendons   The flexor and extensor tendons appear intact without significant tenosynovitis. No focal muscular abnormality.   Soft tissues   As above, probable injury of the plantar plate of the 3rd MTP joint with surrounding soft tissue edema. No focal fluid collection or foreign body.   IMPRESSION: 1. Persistent dorsal and lateral dislocation at the 3rd metatarsophalangeal joint with probable underlying rupture of the plantar plate medially near the metatarsal attachment. 2. No evidence of acute fracture. 3. The forefoot tendons appear intact.     Electronically Signed   By: Carey Bullocks M.D.   On: 01/15/2021 14:38    Assessment:   1. Injury of plantar plate of left foot, initial encounter   2. Metatarsus adductus of left foot      Plan:  Patient was evaluated and treated and all questions answered.  We again discussed definitive surgical treatment for the deformity and the plantar plate injury.  I discussed with her that we could repair the ligament primarily or secondarily without addressing the bony pathology but she likely would have a high chance of recurrence over time.  I think the metatarsal adductus deformity is a major component of the digital pathology.  We also discussed correction of this including tarsometatarsal fusion with corrective bone cuts in addition to the plantar plate repairs.  She elected for this option.  We discussed the risk, benefits and potential complications including but not limited to pain, swelling, infection,  scar, numbness which may be temporary or permanent, chronic pain, stiffness, nerve pain or damage, wound healing problems, bone healing problems including delayed or non-union we also discussed that extensive toe surgery can create neurovascular compromise and put the  toe at risk of limb loss if there is a complication.  She understands these risks and wishes to proceed.  We will schedule surgery for 1 month.  We discussed the postoperative course including the period of nonweightbearing.  I discussed with her preoperative physical therapy and she will let me know if she is interested in doing this.  I do think she should have postoperative physical therapy at home with a home physical therapist and we will have this set up. For her NWB I recommend she use a rolling knee scooter and bathe with a cast protector bag on a shower chair and have ordered these, they will be medically necessary due to inability to put any weight whatsoever on the left lower extremity following surgery.   Surgical plan:  Procedure: -Tarsometatarsal fusion 1 through 3 with metatarsus adductus correction, plantar plate repair of third MTPJ possibly second following stress exam intraoperative with bone graft from ipsilateral heel  Location: -[facility]  Anesthesia plan: -General anesthesia with block  Postoperative pain plan: - Tylenol 1000 mg every 6 hours,  gabapentin 300 mg every 8 hours x5 days, oxycodone 5 mg 1-2 tabs every 6 hours only as needed  DVT prophylaxis: -Xarelto postop  WB Restrictions / DME needs: -NWB in cast postop; knee scooter and shower chair ordered   No follow-ups on file.

## 2021-04-13 ENCOUNTER — Other Ambulatory Visit: Payer: BC Managed Care – PPO | Admitting: *Deleted

## 2021-04-13 DIAGNOSIS — R079 Chest pain, unspecified: Secondary | ICD-10-CM

## 2021-04-13 LAB — LIPID PANEL
Chol/HDL Ratio: 3.2 ratio (ref 0.0–4.4)
Cholesterol, Total: 167 mg/dL (ref 100–199)
HDL: 52 mg/dL (ref >39)
LDL Chol Calc (NIH): 102 mg/dL — ABNORMAL HIGH (ref 0–99)
Triglycerides: 66 mg/dL (ref 0–149)
VLDL Cholesterol Cal: 13 mg/dL (ref 5–40)

## 2021-04-13 LAB — ALT: ALT: 17 IU/L (ref 0–32)

## 2021-04-13 NOTE — Addendum Note (Signed)
Addended byLilian Kapur, Tabithia Stroder R on: 04/13/2021 12:39 PM   Modules accepted: Orders

## 2021-04-14 ENCOUNTER — Telehealth: Payer: Self-pay | Admitting: Cardiology

## 2021-04-14 DIAGNOSIS — M79676 Pain in unspecified toe(s): Secondary | ICD-10-CM

## 2021-04-14 NOTE — Telephone Encounter (Signed)
Patient was returning call 

## 2021-04-14 NOTE — Telephone Encounter (Signed)
Left message for patient to call back  

## 2021-04-15 ENCOUNTER — Telehealth: Payer: Self-pay

## 2021-04-15 DIAGNOSIS — E785 Hyperlipidemia, unspecified: Secondary | ICD-10-CM

## 2021-04-15 MED ORDER — ATORVASTATIN CALCIUM 20 MG PO TABS: 20.0000 mg | ORAL_TABLET | Freq: Every day | ORAL | 3 refills | Status: DC

## 2021-04-15 NOTE — Telephone Encounter (Signed)
The patient has been notified of the result and verbalized understanding.  All questions (if any) were answered. Theresia Majors, RN 04/15/2021 9:47 AM  Rx has been sent in and lab work has been scheduled.

## 2021-04-15 NOTE — Telephone Encounter (Signed)
See above phone note.  

## 2021-04-15 NOTE — Telephone Encounter (Signed)
-----   Message from Quintella Reichert, MD sent at 04/14/2021 10:10 AM EDT ----- LDL not at goal of < 70 - start Lipitor 20mg  daily and repeat FLp and ALT in 6 weeks

## 2021-04-15 NOTE — Telephone Encounter (Signed)
Pt is returning call.  

## 2021-04-28 ENCOUNTER — Telehealth: Payer: Self-pay | Admitting: Urology

## 2021-04-28 NOTE — Telephone Encounter (Signed)
DOS - 05/14/21  OPEN TREATMENT MPJ DISLOCATIONS LEFT -- 79024 ARTHRODESIS LEFT --- 28730   [employer] EFFECTIVE DATE - 09/26/20   PLAN DEDUCTIBLE - $1,500.00 W/ $0.00 REMAINING OUT OF POCKET - $5,900.00 W/ $3,259.63 REMAINING COINSURANCE - 30% COPAY - $0.00     NO PRIOR AUTH REQUIRED

## 2021-05-05 DIAGNOSIS — M2011 Hallux valgus (acquired), right foot: Secondary | ICD-10-CM | POA: Diagnosis not present

## 2021-05-14 ENCOUNTER — Other Ambulatory Visit: Payer: Self-pay | Admitting: Podiatry

## 2021-05-14 DIAGNOSIS — S99922A Unspecified injury of left foot, initial encounter: Secondary | ICD-10-CM

## 2021-05-14 DIAGNOSIS — Q66222 Congenital metatarsus adductus, left foot: Secondary | ICD-10-CM | POA: Diagnosis not present

## 2021-05-14 MED ORDER — ACETAMINOPHEN 500 MG PO TABS: 1000.0000 mg | ORAL_TABLET | Freq: Four times a day (QID) | ORAL | 0 refills | Status: AC | PRN

## 2021-05-14 MED ORDER — GABAPENTIN 300 MG PO CAPS: 300.0000 mg | ORAL_CAPSULE | Freq: Three times a day (TID) | ORAL | 0 refills | Status: DC

## 2021-05-14 MED ORDER — OXYCODONE HCL 5 MG PO TABS: 5.0000 mg | ORAL_TABLET | ORAL | 0 refills | Status: AC | PRN

## 2021-05-14 MED ORDER — RIVAROXABAN 10 MG PO TABS: 10.0000 mg | ORAL_TABLET | Freq: Every day | ORAL | 0 refills | Status: DC

## 2021-05-14 NOTE — Progress Notes (Signed)
8/19 [place] met adductus repair and plantar plate repair

## 2021-05-16 ENCOUNTER — Telehealth: Payer: Self-pay | Admitting: Podiatry

## 2021-05-16 NOTE — Telephone Encounter (Signed)
Patient called saying that she was having swelling and the cast was tight.  When I called her back she states that she had the foot elevated and she was icing and she was getting relief in the cast was not feeling tight.  She is able to wiggle her toes.  She has good color to her digits.  She states that when she puts her foot down is when it starts to hurt.  I encouraged her to keep the foot iced and elevated.  If gets worse overnight she got her urgent care to get the cast bivalved otherwise we can see her in the morning for cast change.  She states that she is can see how she does overnight and she will call the office in the morning if needed.  She had no further questions or concerns.

## 2021-05-17 ENCOUNTER — Telehealth: Payer: Self-pay | Admitting: Podiatry

## 2021-05-17 NOTE — Telephone Encounter (Signed)
I spoke with the patient by phone her pain has improved and she has been keeping it elevated.  Has had difficulty getting her knee scooter she is calling today to see if they had it in stock finally it was out of stock before surgery.  Denies any falls or injuries on the foot.  Otherwise feeling well.  We will see her on Thursday all questions addressed

## 2021-05-20 ENCOUNTER — Ambulatory Visit (INDEPENDENT_AMBULATORY_CARE_PROVIDER_SITE_OTHER): Payer: BC Managed Care – PPO

## 2021-05-20 ENCOUNTER — Ambulatory Visit (INDEPENDENT_AMBULATORY_CARE_PROVIDER_SITE_OTHER): Payer: BC Managed Care – PPO | Admitting: Podiatry

## 2021-05-20 ENCOUNTER — Other Ambulatory Visit: Payer: Self-pay

## 2021-05-20 DIAGNOSIS — S99922D Unspecified injury of left foot, subsequent encounter: Secondary | ICD-10-CM

## 2021-05-20 DIAGNOSIS — Q66222 Congenital metatarsus adductus, left foot: Secondary | ICD-10-CM

## 2021-05-24 NOTE — Progress Notes (Signed)
  Subjective:  Patient ID: Tiffany Mullins, female    DOB: 08-07-1966,  MRN: [id number]  Chief Complaint  Patient presents with   Routine Post Op     POV #1 DOS 05/14/2021 REPAIR LIGAMENTS IN TOES 3 POSS 2ND, FUSION  OF JOINT IN MIDFOOT W/BONE GRAFT FROM HEEL     55 y.o. female returns for post-op check.  She is doing well she not having much pain she has been using the knee scooter the cast felt very tight though  Review of Systems: Negative except as noted in the HPI. Denies N/V/F/Ch.   Objective:  There were no vitals filed for this visit. There is no height or weight on file to calculate BMI. Constitutional Well developed. Well nourished.  Vascular Foot warm and well perfused. Capillary refill normal to all digits.   Neurologic Normal speech. Oriented to person, place, and time. Epicritic sensation to light touch grossly present bilaterally.  Dermatologic Skin healing well without signs of infection. Skin edges well coapted without signs of infection.  Orthopedic: Tenderness to palpation noted about the surgical site.  She has significant edema, no signs of hematoma   Multiple view plain film radiographs: New multiple view radiographs taken today show good correction of deformity with intact hardware and Kirschner wires in good position Assessment:   1. Injury of plantar plate, left, subsequent encounter   2. Metatarsus adductus of left foot    Plan:  Patient was evaluated and treated and all questions answered.  S/p foot surgery left -Progressing as expected post-operatively. -XR: As above -WB Status: NWB in below-knee cast with knee scooter -Sutures: Removed at next visit. -Medications: No refills required -Foot redressed.  Well-padded short leg below-knee cast was applied  Return in about 2 weeks (around 06/03/2021) for post op (no x-rays), suture removal.

## 2021-06-01 ENCOUNTER — Telehealth: Payer: Self-pay | Admitting: *Deleted

## 2021-06-01 NOTE — Telephone Encounter (Signed)
Patient is calling because she fell trying to get on her scooter today,she did not fall on the surgery foot but it did hit the floor,now having sharp pain in that foot.  Explained to keep icing and elevating the foot,verbalized understanding and said that she will take her pain medicine (has not needed yet).She has an upcoming appointment on 06/03/21 Please advise.

## 2021-06-02 NOTE — Telephone Encounter (Signed)
Returned call to patient to inform that the physician said that he will evaluate her on appointment date 06/03/21, verbalized understanding and said that the foot is a little swollen but  she is ok, taking pain medicine, elevating and icing as instructed.

## 2021-06-03 ENCOUNTER — Ambulatory Visit (INDEPENDENT_AMBULATORY_CARE_PROVIDER_SITE_OTHER): Payer: BC Managed Care – PPO | Admitting: Podiatry

## 2021-06-03 ENCOUNTER — Other Ambulatory Visit: Payer: Self-pay

## 2021-06-03 DIAGNOSIS — S99922D Unspecified injury of left foot, subsequent encounter: Secondary | ICD-10-CM | POA: Diagnosis not present

## 2021-06-03 DIAGNOSIS — Q66222 Congenital metatarsus adductus, left foot: Secondary | ICD-10-CM

## 2021-06-03 NOTE — Patient Instructions (Signed)
Look for urea 40% cream or ointment and apply to the thickened dry skin / calluses. This can be bought over the counter, at a pharmacy or online such as [employer].  

## 2021-06-04 NOTE — Progress Notes (Signed)
  Subjective:  Patient ID: Tiffany Mullins, female    DOB: 1966-05-04,  MRN: [id number]  Chief Complaint  Patient presents with   Routine Post Op     POV #2 DOS 05/14/2021 REPAIR LIGAMENTS IN TOES 3 POSS 2ND, FUSION  OF JOINT IN MIDFOOT W/BONE GRAFT FROM HEEL     55 y.o. female returns for post-op check.  She is doing well she did fall off her knee scooter but did not put any weight on the operative foot and the other foot is feeling fine now  Review of Systems: Negative except as noted in the HPI. Denies N/V/F/Ch.   Objective:  There were no vitals filed for this visit. There is no height or weight on file to calculate BMI. Constitutional Well developed. Well nourished.  Vascular Foot warm and well perfused. Capillary refill normal to all digits.   Neurologic Normal speech. Oriented to person, place, and time. Epicritic sensation to light touch grossly present bilaterally.  Dermatologic Skin healing well without signs of infection. Skin edges well coapted without signs of infection.  Orthopedic: Tenderness to palpation noted about the surgical site.  She has significant edema, no signs of hematoma   Multiple view plain film radiographs: show good correction of deformity with intact hardware and Kirschner wires in good position Assessment:   1. Injury of plantar plate, left, subsequent encounter   2. Metatarsus adductus of left foot    Plan:  Patient was evaluated and treated and all questions answered.  S/p foot surgery left -Progressing as expected post-operatively. -XR: As above -WB Status: NWB in below-knee cast with knee scooter, new cast applied today -Sutures: Removed today -Medications: No refills required -Foot redressed.  Well-padded short leg below-knee cast was applied  Return in about 5 weeks (around 07/08/2021) for post op (new x-rays) and pin removal .

## 2021-06-10 ENCOUNTER — Other Ambulatory Visit: Payer: Self-pay | Admitting: Podiatry

## 2021-06-14 ENCOUNTER — Ambulatory Visit (INDEPENDENT_AMBULATORY_CARE_PROVIDER_SITE_OTHER): Payer: BC Managed Care – PPO

## 2021-06-14 ENCOUNTER — Ambulatory Visit (INDEPENDENT_AMBULATORY_CARE_PROVIDER_SITE_OTHER): Payer: BC Managed Care – PPO | Admitting: Podiatry

## 2021-06-14 ENCOUNTER — Other Ambulatory Visit: Payer: Self-pay

## 2021-06-14 ENCOUNTER — Other Ambulatory Visit: Payer: BC Managed Care – PPO

## 2021-06-14 DIAGNOSIS — S99922D Unspecified injury of left foot, subsequent encounter: Secondary | ICD-10-CM

## 2021-06-14 DIAGNOSIS — Z9889 Other specified postprocedural states: Secondary | ICD-10-CM

## 2021-06-14 NOTE — Progress Notes (Signed)
   Subjective:  Patient presents today status post left foot surgery. DOS: 05/14/2021.  Patient states that over the weekend she sustained a fall injury and she was concerned.  She came in this morning as an urgent work in for follow-up treatment to ensure that nothing is wrong with the surgical site  Past Medical History:  Diagnosis Date   Allergic rhinitis    Carpal tunnel syndrome    GERD (gastroesophageal reflux disease)    Hypertension    Left sided sciatica    Morbid obesity (HCC)    Vitamin D deficiency       Objective/Physical Exam Neurovascular status intact.  Cast left intact.  No active bleeding noted.  The cast is comfortable.  Radiographic Exam:  Orthopedic hardware and osteotomies sites appear to be stable with routine healing.  No significant change since prior x-rays.  All hardware appears stable and intact including the percutaneous fixation pins  Assessment: 1. s/p left forefoot surgery. DOS: 05/14/2021   Plan of Care:  1. Patient was evaluated. X-rays reviewed 2.  Reassured the patient that her fall injury this weekend was not detrimental to her surgery.  She is doing well.  Continue nonweightbearing. 3.  Continue using the knee scooter for nonweightbearing 4.  Return to clinic on neck scheduled appointment with Dr. Perrin Smack, DPM [facility]  Dr. Felecia Shelling, DPM    [address] [place], [place] [address]                Office [phone]  Fax [phone]

## 2021-06-21 ENCOUNTER — Telehealth: Payer: Self-pay | Admitting: *Deleted

## 2021-06-21 NOTE — Telephone Encounter (Signed)
Patient is calling with a complaint of having quite a bit of foot pain and discomfort.  Returned call back to patient for more information,no answer, left vmessage for call back.

## 2021-06-24 ENCOUNTER — Encounter: Payer: BC Managed Care – PPO | Admitting: Podiatry

## 2021-07-02 ENCOUNTER — Telehealth: Payer: Self-pay | Admitting: Podiatry

## 2021-07-02 DIAGNOSIS — Z9889 Other specified postprocedural states: Secondary | ICD-10-CM

## 2021-07-02 DIAGNOSIS — Q66222 Congenital metatarsus adductus, left foot: Secondary | ICD-10-CM

## 2021-07-02 NOTE — Telephone Encounter (Signed)
Patient is requesting Physical Therapy. She forgot to follow up after sx.

## 2021-07-08 ENCOUNTER — Ambulatory Visit (INDEPENDENT_AMBULATORY_CARE_PROVIDER_SITE_OTHER): Payer: BC Managed Care – PPO | Admitting: Podiatry

## 2021-07-08 ENCOUNTER — Ambulatory Visit (INDEPENDENT_AMBULATORY_CARE_PROVIDER_SITE_OTHER): Payer: BC Managed Care – PPO

## 2021-07-08 ENCOUNTER — Other Ambulatory Visit: Payer: Self-pay

## 2021-07-08 DIAGNOSIS — Z9889 Other specified postprocedural states: Secondary | ICD-10-CM

## 2021-07-08 DIAGNOSIS — S99922D Unspecified injury of left foot, subsequent encounter: Secondary | ICD-10-CM

## 2021-07-08 DIAGNOSIS — Q66222 Congenital metatarsus adductus, left foot: Secondary | ICD-10-CM

## 2021-07-09 ENCOUNTER — Telehealth: Payer: Self-pay | Admitting: *Deleted

## 2021-07-09 NOTE — Telephone Encounter (Signed)
Patient is calling to find out when she is coming off the Xarelto, and why she is not wearing a foot boot.Please advise.  Returned call back to patient and explained why the boot after surgery(usually boot after surgery and then gradually work into the darco shoe), verbalized understanding and that doctor will inform when to stop the Xarelto.

## 2021-07-11 ENCOUNTER — Other Ambulatory Visit: Payer: Self-pay | Admitting: Podiatry

## 2021-07-12 ENCOUNTER — Encounter: Payer: Self-pay | Admitting: Podiatry

## 2021-07-13 ENCOUNTER — Encounter: Payer: Self-pay | Admitting: Podiatry

## 2021-07-13 NOTE — Progress Notes (Signed)
  Subjective:  Patient ID: Tiffany Mullins, female    DOB: 1966/05/07,  MRN: [id number]  Chief Complaint  Patient presents with   Routine Post Op    POV #3 DOS 05/14/2021 REPAIR LIGAMENTS IN TOES 3 POSS 2ND, FUSION  OF JOINT IN MIDFOOT W/BONE GRAFT FROM HEEL     55 y.o. female returns for post-op check.  She is doing well has not had any falls pain is improving  Review of Systems: Negative except as noted in the HPI. Denies N/V/F/Ch.   Objective:  There were no vitals filed for this visit. There is no height or weight on file to calculate BMI. Constitutional Well developed. Well nourished.  Vascular Foot warm and well perfused. Capillary refill normal to all digits.   Neurologic Normal speech. Oriented to person, place, and time. Epicritic sensation to light touch grossly present bilaterally.  Dermatologic Skin healing well without signs of infection. Skin edges well coapted without signs of infection.  Orthopedic: Tenderness to palpation noted about the surgical site.  She has significant edema, no signs of hematoma   Multiple view plain film radiographs: show good correction of deformity with intact hardware and Kirschner wires in good position, good bridging across arthrodesis sites Assessment:   1. Injury of plantar plate, left, subsequent encounter   2. Metatarsus adductus of left foot   3. Status post left foot surgery    Plan:  Patient was evaluated and treated and all questions answered.  S/p foot surgery left -Progressing as expected post-operatively. -XR: As above -WB Status: Cast removed today and CAM boot applied.  She can begin to transition to weightbearing in the cam boot as tolerated.  Advised on the transition process.  She will return in 4 weeks for new x-rays  Return in about 4 weeks (around 08/05/2021) for post op (no x-rays).

## 2021-07-20 NOTE — Addendum Note (Signed)
Addended byLilian Kapur, Keili Hasten R on: 07/20/2021 08:41 AM   Modules accepted: Orders

## 2021-07-28 ENCOUNTER — Encounter: Payer: Self-pay | Admitting: Physical Therapy

## 2021-07-28 ENCOUNTER — Other Ambulatory Visit: Payer: Self-pay

## 2021-07-28 ENCOUNTER — Ambulatory Visit: Payer: BC Managed Care – PPO | Attending: Podiatry | Admitting: Physical Therapy

## 2021-07-28 DIAGNOSIS — Q66222 Congenital metatarsus adductus, left foot: Secondary | ICD-10-CM | POA: Insufficient documentation

## 2021-07-28 DIAGNOSIS — M79672 Pain in left foot: Secondary | ICD-10-CM | POA: Diagnosis present

## 2021-07-28 DIAGNOSIS — M6281 Muscle weakness (generalized): Secondary | ICD-10-CM | POA: Insufficient documentation

## 2021-07-28 DIAGNOSIS — R2689 Other abnormalities of gait and mobility: Secondary | ICD-10-CM | POA: Insufficient documentation

## 2021-07-28 DIAGNOSIS — Z9889 Other specified postprocedural states: Secondary | ICD-10-CM | POA: Insufficient documentation

## 2021-07-28 NOTE — Therapy (Signed)
[facility] [address] [place], [place], [address] Phone: [phone]   Fax:  [phone]  Physical Therapy Evaluation  Patient Details  Name: Tiffany Mullins MRN: [id number] Date of Birth: 28-Nov-1965 Referring Provider (PT): Edwin Cap, [place]  Encounter Date: 07/28/2021   PT End of Session - 07/28/21 1536     Visit Number 1    Number of Visits 16    Date for PT Re-Evaluation 09/22/21    Authorization Type BCBS    PT Start Time 1400    PT Stop Time 1445    PT Time Calculation (min) 45 min             Past Medical History:  Diagnosis Date   Allergic rhinitis    Carpal tunnel syndrome    GERD (gastroesophageal reflux disease)    Hypertension    Left sided sciatica    Morbid obesity (HCC)    Vitamin D deficiency     Past Surgical History:  Procedure Laterality Date   BREAST BIOPSY Left     There were no vitals filed for this visit.    Subjective Assessment - 07/28/21 1535     Subjective Tiffany Mullins is a 55 y.o. female who presents to clinic with chief complaint of L foot pain following valgus correction of digits 2 and 3 via fusion on 8/19.  MOI/History of condition: chronic pain of L foot for >1 year with no trauma.  Pain location: diffuse L foot pain on lateral and medial plantar surface with occasional pain over incision.  Red flags: denies.  48 hour pain intensity:  highest 4-5/10, current 2/10, best 1/10.  Aggs: walking, w/b.  Eases: rest, ice.  Nature: aching.  Severity: mod.  Irritability: mod.  Stage: chronic.  Stability: getting.  24 hour pattern: NA.  Vocation/requirements: drive school bus.  Hobbies: NA.  Functional limitations/goals: walking, steps, work.  Home environment: lives with roommate at home, 1 STE, 2 story house.  Assistive device: single crutch.                 [facility] PT Assessment - 07/28/21 0001       Assessment   Medical Diagnosis Referred by: Edwin Cap, DPM  (07/20/2021)    Referring Provider (PT) Edwin Cap, DPM    Onset Date/Surgical Date 05/14/21    Hand Dominance Right    Next MD Visit 11/10    Prior Therapy none      Precautions   Precaution Comments None      Restrictions   Other Position/Activity Restrictions WBAT in CAM boot      Balance Screen   Has the patient fallen in the past 6 months No      Observation/Other Assessments   Observations L cam boot, antalgic gait, mild/mod edema L foot and ankle    Focus on Therapeutic Outcomes (FOTO)  55 - 69      Sensation   Light Touch Appears Intact      Functional Tests   Functional tests Other;Sit to Stand      Sit to Stand   Comments 30'' STS: 10x  UE used? Y      Other:   Other/ Comments 10 m max gait speed: 20'', .5 m/s, AD: crutch and CAM boot      ROM / Strength   AROM / PROM / Strength AROM;Strength      AROM   AROM Assessment Site Ankle  Right/Left Ankle Right;Left    Right Ankle Dorsiflexion 8    Right Ankle Inversion 40    Right Ankle Eversion 30    Left Ankle Dorsiflexion --   lacking 2 degrees   Left Ankle Inversion 22    Left Ankle Eversion 30      Strength   Overall Strength Comments R LE grossly 5/5 L LE grossly 4/5    Strength Assessment Site Ankle    Right/Left Ankle Left                        Objective measurements completed on examination: See above findings.                PT Education - 07/28/21 1426     Education Details POC, diagnosis, prognosis, HEP, FOTO.  Pt educated via explanation, demonstration, and handout (HEP).  Pt confirms understanding verbally.              PT Short Term Goals - 07/28/21 1539       PT SHORT TERM GOAL #1   Title [name] will be >75% HEP compliant to improve carryover between sessions and facilitate independent management of condition    Target Date 08/18/21               PT Long Term Goals - 07/28/21 1540       PT LONG TERM GOAL #1   Title  Aubriauna G Worth will improve 10 meter max gait speed to .8 m/s (.1 m/s MCID) to show functional improvement in ambulation  EVAL: .5 m/s  target date: 09/22/21      PT LONG TERM GOAL #2   Title Esme G Joy will improve 30'' STS (MCID 2) to >/= 12x (w/ UE: N) to show improved LE strength and improved transfers  EVAL: 10x  w/ UE? Y   target date: 09/22/21      PT LONG TERM GOAL #3   Title Offie G Derwin will improve FOTO score from 55 (on evaluation) to 31 as a proxy for functional improvement  target date: 09/22/21      PT LONG TERM GOAL #4   Title Jessicaann G Krygier will be able to stand for >30'' in tandem stance, to show a significant improvement in balance in order to reduce fall risk  EVAL: unable  target date: 09/22/21      PT LONG TERM GOAL #5   Title Arma G Adamcik will achieve greater than 40 degrees of closed chain left ankle DF of the affected ankle (inclinometer placed just distal to tibial tuberosity; "zeroed" vertically; 1/2 kneeling if able; measured on forward leg  ideal is between 40 - 50 degrees) to normalize terminal stance phase of gait and reduce stress on posterior ankle structures  EVAL: n/t d/t precautions but shows deficit in DF L>R  target date: 09/22/21                     Plan - 07/28/21 1537     Clinical Impression Statement Tiffany Mullins is a 55 y.o. female who presents to clinic with signs and sxs consistent with L foot pain following valgus correction of digits 2 and 3 via fusion on 8/19.  She was recently released for WBAT in CAM boot.  Pt presents with pain and impairments/deficits in: L ankle ROM, L LE strength, balance, gait.  Activity limitations include: ambulation, steps, transfers.  Participation limitations include: community  ambulation, work as Midwife.  Pt will benefit from skilled therapy to address pain and the listed deficits in order to achieve functional goals, enable safety and independence in  completion of daily tasks, and return to PLOF.    Stability/Clinical Decision Making Stable/Uncomplicated    Clinical Decision Making Low    Rehab Potential Good    PT Frequency Other (comment)   1-2x/week   PT Duration 8 weeks    PT Treatment/Interventions ADLs/Self Care Home Management;Aquatic Therapy;Iontophoresis 4mg /ml Dexamethasone;Electrical Stimulation;Gait training;Therapeutic activities;Therapeutic exercise;Neuromuscular re-education;Manual techniques;Dry needling;Vasopneumatic Device;Spinal Manipulations;Joint Manipulations    PT Next Visit Plan progressive WB/strength/balance/gait as appropriate    PT Home Exercise Plan [email or web address]             Patient will benefit from skilled therapeutic intervention in order to improve the following deficits and impairments:  Abnormal gait, Decreased balance, Decreased endurance, Difficulty walking, Pain, Decreased strength, Decreased range of motion  Visit Diagnosis: Pain in left foot - Plan: PT plan of care cert/re-cert  Muscle weakness - Plan: PT plan of care cert/re-cert  Balance problem - Plan: PT plan of care cert/re-cert  Other abnormalities of gait and mobility - Plan: PT plan of care cert/re-cert     Problem List Patient Active Problem List   Diagnosis Date Noted   Leg edema, left 12/29/2016   Varicose veins of leg with pain, left 12/29/2016   Varicose veins with pain 12/29/2016   Chronic venous insufficiency 07/17/2014   Morbid obesity due to excess calories (HCC) 07/17/2014    07/19/2014, PT 07/28/2021, 3:49 PM  [facility] [address] [place], [place], [place] Phone: [phone]   Fax:  [phone]  Name: MAKEYA HILGERT MRN: [name] Date of Birth: [birth year]

## 2021-07-28 NOTE — Patient Instructions (Signed)
Access Code: [email or web address] URL: [email or web address] Date: 07/28/2021 Prepared by: Alphonzo Severance  Exercises Seated Ankle Alphabet - 3 x daily - 7 x weekly - 3 sets - 10 reps Side to Side Weight Shift with Counter Support - 1 x daily - 7 x weekly - 3 sets - 10 reps WEARING BOOT Seated Toe Towel Scrunches - 1 x daily - 7 x weekly - 3 sets - 10 reps

## 2021-08-05 ENCOUNTER — Other Ambulatory Visit: Payer: Self-pay

## 2021-08-05 ENCOUNTER — Ambulatory Visit (INDEPENDENT_AMBULATORY_CARE_PROVIDER_SITE_OTHER): Payer: BC Managed Care – PPO

## 2021-08-05 ENCOUNTER — Ambulatory Visit (INDEPENDENT_AMBULATORY_CARE_PROVIDER_SITE_OTHER): Payer: BC Managed Care – PPO | Admitting: Podiatry

## 2021-08-05 DIAGNOSIS — S99922D Unspecified injury of left foot, subsequent encounter: Secondary | ICD-10-CM

## 2021-08-05 DIAGNOSIS — Q66222 Congenital metatarsus adductus, left foot: Secondary | ICD-10-CM

## 2021-08-09 NOTE — Progress Notes (Signed)
  Subjective:  Patient ID: Tiffany Mullins, female    DOB: August 16, 1966,  MRN: [id number]  Chief Complaint  Patient presents with   Routine Post Op     (xray)POV #4 DOS 05/14/2021 REPAIR LIGAMENTS IN TOES 3 POSS 2ND, FUSION  OF JOINT IN MIDFOOT W/BONE GRAFT FROM HEEL LEFT     55 y.o. female returns for post-op check.  She is doing well has not had any falls pain is improving  Review of Systems: Negative except as noted in the HPI. Denies N/V/F/Ch.   Objective:  There were no vitals filed for this visit. There is no height or weight on file to calculate BMI. Constitutional Well developed. Well nourished.  Vascular Foot warm and well perfused. Capillary refill normal to all digits.   Neurologic Normal speech. Oriented to person, place, and time. Epicritic sensation to light touch grossly present bilaterally.  Dermatologic Skin healing well without signs of infection. Skin edges well coapted without signs of infection.  Orthopedic: Tenderness to palpation noted about the surgical site.  She has significant edema, no signs of hematoma   Multiple view plain film radiographs: show good correction of deformity with intact hardware in good position, good bridging across arthrodesis sites Assessment:   1. Injury of plantar plate, left, subsequent encounter   2. Metatarsus adductus of left foot    Plan:  Patient was evaluated and treated and all questions answered.  S/p foot surgery left -Doing well her radiographs show good early consolidation across the fusion sites, she may begin to transition back to regular shoe gear gradually which I reviewed this process with her.  Return in 2 months or sooner if needed for new x-rays  No follow-ups on file.

## 2021-08-10 ENCOUNTER — Ambulatory Visit: Payer: BC Managed Care – PPO

## 2021-08-10 ENCOUNTER — Other Ambulatory Visit: Payer: Self-pay

## 2021-08-10 DIAGNOSIS — R2689 Other abnormalities of gait and mobility: Secondary | ICD-10-CM

## 2021-08-10 DIAGNOSIS — M6281 Muscle weakness (generalized): Secondary | ICD-10-CM

## 2021-08-10 DIAGNOSIS — M79672 Pain in left foot: Secondary | ICD-10-CM

## 2021-08-10 NOTE — Therapy (Signed)
[facility] Out[facility] [address] [place], [place], [address] Phone: [phone]   Fax:  [phone]  Physical Therapy Treatment  Patient Details  Name: Tiffany Mullins MRN: [id number] Date of Birth: 1966/08/02 Referring Provider (PT): Edwin Cap, [place]   Encounter Date: 08/10/2021   PT End of Session - 08/10/21 1523     Visit Number 2    Number of Visits 16    Date for PT Re-Evaluation 09/22/21    Authorization Type BCBS    PT Start Time 1525    PT Stop Time 1603    PT Time Calculation (min) 38 min    Activity Tolerance Patient tolerated treatment well    Behavior During Therapy WFL for tasks assessed/performed             Past Medical History:  Diagnosis Date   Allergic rhinitis    Carpal tunnel syndrome    GERD (gastroesophageal reflux disease)    Hypertension    Left sided sciatica    Morbid obesity (HCC)    Vitamin D deficiency     Past Surgical History:  Procedure Laterality Date   BREAST BIOPSY Left     There were no vitals filed for this visit.   Subjective Assessment - 08/10/21 1525     Subjective Pt presents to PT with continued reports of L foot pain. Pt has been slowly progressing to using tennis shoes at home, does note some swelling after 15 min. Ready to begin PT at this time.    Currently in Pain? Yes    Pain Score 4     Pain Location Foot    Pain Orientation Left           [name]:   Therapeutic Exercise:  Towel crunches 2x30 sec Towel inv/ev x 10 L foot Seated heel-toe raise 2x15 L ABC L foot/ankle x 1 L ankle 4-way 2x10 YTB BAPS circles CW/CCW 3x10 ea L L2 L calf stretch w/ towel 2x30 sec                               PT Short Term Goals - 07/28/21 1539       PT SHORT TERM GOAL #1   Title Tiffany Mullins will be >75% HEP compliant to improve carryover between sessions and facilitate independent management of condition     Target Date 08/18/21               PT Long Term Goals - 07/28/21 1540       PT LONG TERM GOAL #1   Title Tiffany Mullins will improve 10 meter max gait speed to .8 m/s (.1 m/s MCID) to show functional improvement in ambulation  EVAL: .5 m/s  target date: 09/22/21      PT LONG TERM GOAL #2   Title Tiffany Mullins will improve 30'' STS (MCID 2) to >/= 12x (w/ UE: N) to show improved LE strength and improved transfers  EVAL: 10x  w/ UE? Y   target date: 09/22/21      PT LONG TERM GOAL #3   Title Tiffany Mullins will improve FOTO score from 55 (on evaluation) to 58 as a proxy for functional improvement  target date: 09/22/21      PT LONG TERM GOAL #4   Title Tiffany Mullins will be able to stand for >30'' in tandem stance, to show  a significant improvement in balance in order to reduce fall risk  EVAL: unable  target date: 09/22/21      PT LONG TERM GOAL #5   Title Tiffany Mullins will achieve greater than 40 degrees of closed chain left ankle DF of the affected ankle (inclinometer placed just distal to tibial tuberosity; "zeroed" vertically; 1/2 kneeling if able; measured on forward leg  ideal is between 40 - 50 degrees) to normalize terminal stance phase of gait and reduce stress on posterior ankle structures  EVAL: n/t d/t precautions but shows deficit in DF L>R  target date: 09/22/21                   Plan - 08/10/21 1532     Clinical Impression Statement Pt was able to complete all prescribed exercises with no adverse effect. Today's session we focused on continuing to increasing L ankle/foot motor control and L ankle strength. She continues to benefit from skilled PT services and should contineu to be seen and progressed as tolerated.    PT Treatment/Interventions ADLs/Self Care Home Management;Aquatic Therapy;Iontophoresis 4mg /ml Dexamethasone;Electrical Stimulation;Gait training;Therapeutic activities;Therapeutic exercise;Neuromuscular  re-education;Manual techniques;Dry needling;Vasopneumatic Device;Spinal Manipulations;Joint Manipulations    PT Next Visit Plan progressive WB/strength/balance/gait as appropriate    PT Home Exercise Plan [email or web address]             Patient will benefit from skilled therapeutic intervention in order to improve the following deficits and impairments:  Abnormal gait, Decreased balance, Decreased endurance, Difficulty walking, Pain, Decreased strength, Decreased range of motion  Visit Diagnosis: Pain in left foot  Muscle weakness  Balance problem  Other abnormalities of gait and mobility     Problem List Patient Active Problem List   Diagnosis Date Noted   Leg edema, left 12/29/2016   Varicose veins of leg with pain, left 12/29/2016   Varicose veins with pain 12/29/2016   Chronic venous insufficiency 07/17/2014   Morbid obesity due to excess calories (HCC) 07/17/2014    07/19/2014, PT 08/10/2021, 4:05 PM  [facility] [address] [place], [place], [place] Phone: [phone]   Fax:  [phone]  Name: Tiffany Mullins MRN: Tiffany Mullins Date of Birth: December 30, 1965

## 2021-08-11 ENCOUNTER — Telehealth: Payer: Self-pay | Admitting: Podiatry

## 2021-08-11 NOTE — Telephone Encounter (Signed)
Yes I think that would be fine. We had discussed a few weeks but that would put her back for just a few days before the Christmas break (she's a school bus driver) so let's say after 09/26/21 to be safe and she'll be 100%

## 2021-08-11 NOTE — Telephone Encounter (Signed)
I spoke with Tiffany Mullins today and she does not feel that she is up to returning to work just yet. She is still experiencing pain. Tiffany Mullins stated that you guys discussed her returning to work next year. Please advise, should I extend her leave until sometime next year?

## 2021-08-12 ENCOUNTER — Encounter: Payer: Self-pay | Admitting: Podiatry

## 2021-08-12 ENCOUNTER — Ambulatory Visit: Payer: BC Managed Care – PPO

## 2021-08-12 ENCOUNTER — Other Ambulatory Visit: Payer: Self-pay

## 2021-08-12 DIAGNOSIS — R2689 Other abnormalities of gait and mobility: Secondary | ICD-10-CM

## 2021-08-12 DIAGNOSIS — M6281 Muscle weakness (generalized): Secondary | ICD-10-CM

## 2021-08-12 DIAGNOSIS — M79672 Pain in left foot: Secondary | ICD-10-CM | POA: Diagnosis not present

## 2021-08-12 NOTE — Therapy (Signed)
[facility] [address] [place], [place], [address] Phone: [phone]   Fax:  [phone]  Physical Therapy Treatment  Patient Details  Name: Tiffany Mullins MRN: [id number] Date of Birth: 07-10-1966 Referring Provider (PT): Edwin Cap, [place]   Encounter Date: 08/12/2021   PT End of Session - 08/12/21 1700     Visit Number 3    Number of Visits 16    Date for PT Re-Evaluation 09/22/21    Authorization Type BCBS    PT Start Time 1700    PT Stop Time 1740    PT Time Calculation (min) 40 min    Activity Tolerance Patient tolerated treatment well    Behavior During Therapy [facility] for tasks assessed/performed             Past Medical History:  Diagnosis Date   Allergic rhinitis    Carpal tunnel syndrome    GERD (gastroesophageal reflux disease)    Hypertension    Left sided sciatica    Morbid obesity (HCC)    Vitamin D deficiency     Past Surgical History:  Procedure Laterality Date   BREAST BIOPSY Left     There were no vitals filed for this visit.   Subjective Assessment - 08/12/21 1700     Subjective Pt presents to PT with reports of increased L foot pain. She has been compliant with her HEP with no adverse. Pt is ready to begin PT treatment at this time.    Currently in Pain? Yes    Pain Score 7     Pain Location Foot    Pain Orientation Left           [name]:   Therapeutic Exercise:  Towel crunches 2x30 sec Towel inv/ev x 5 L foot Seated heel-toe raise 3x15 L ABC L foot/ankle x 1 L ankle DF/inv/ev 3x10 RTB BAPS circles CW/CCW 2x10 ea L L2 L calf stretch w/ towel 2x30 sec Weight shifts out of boot 2x10                                PT Short Term Goals - 07/28/21 1539       PT SHORT TERM GOAL #1   Title Tiffany Mullins will be >75% HEP compliant to improve carryover between sessions and facilitate independent management of  condition    Target Date 08/18/21               PT Long Term Goals - 07/28/21 1540       PT LONG TERM GOAL #1   Title Tiffany Mullins will improve 10 meter max gait speed to .8 m/s (.1 m/s MCID) to show functional improvement in ambulation  EVAL: .5 m/s  target date: 09/22/21      PT LONG TERM GOAL #2   Title Tiffany Mullins will improve 30'' STS (MCID 2) to >/= 12x (w/ UE: N) to show improved LE strength and improved transfers  EVAL: 10x  w/ UE? Y   target date: 09/22/21      PT LONG TERM GOAL #3   Title Tiffany Mullins will improve FOTO score from 55 (on evaluation) to 27 as a proxy for functional improvement  target date: 09/22/21      PT LONG TERM GOAL #4   Title Tiffany Mullins will be able to stand for >30'' in tandem stance,  to show a significant improvement in balance in order to reduce fall risk  EVAL: unable  target date: 09/22/21      PT LONG TERM GOAL #5   Title Tiffany Mullins will achieve greater than 40 degrees of closed chain left ankle DF of the affected ankle (inclinometer placed just distal to tibial tuberosity; "zeroed" vertically; 1/2 kneeling if able; measured on forward leg  ideal is between 40 - 50 degrees) to normalize terminal stance phase of gait and reduce stress on posterior ankle structures  EVAL: n/t d/t precautions but shows deficit in DF L>R  target date: 09/22/21                   Plan - 08/12/21 1713     Clinical Impression Statement Pt was again able to complete all prescribed exercises with no adverse effect. Continues to show decreased motor control and strength for distal L LE, along with continued ambulation in CAM boot. Today we focused on improving motor control and ROM for L ankle and foot. She continues to benefit from skilled PT services and will continue to be seen and progressed as tolerated.    PT Treatment/Interventions ADLs/Self Care Home Management;Aquatic Therapy;Iontophoresis 4mg /ml  Dexamethasone;Electrical Stimulation;Gait training;Therapeutic activities;Therapeutic exercise;Neuromuscular re-education;Manual techniques;Dry needling;Vasopneumatic Device;Spinal Manipulations;Joint Manipulations    PT Next Visit Plan progressive WB/strength/balance/gait as appropriate    PT Home Exercise Plan [email or web address]             Patient will benefit from skilled therapeutic intervention in order to improve the following deficits and impairments:  Abnormal gait, Decreased balance, Decreased endurance, Difficulty walking, Pain, Decreased strength, Decreased range of motion  Visit Diagnosis: Pain in left foot  Muscle weakness  Balance problem  Other abnormalities of gait and mobility     Problem List Patient Active Problem List   Diagnosis Date Noted   Leg edema, left 12/29/2016   Varicose veins of leg with pain, left 12/29/2016   Varicose veins with pain 12/29/2016   Chronic venous insufficiency 07/17/2014   Morbid obesity due to excess calories (HCC) 07/17/2014    07/19/2014, PT 08/12/2021, 5:42 PM  [facility] [address] [place], [place], [place] Phone: [phone]   Fax:  [phone]  Name: Tiffany Mullins MRN: Tiffany Mullins Date of Birth: December 30, 1965

## 2021-08-24 ENCOUNTER — Other Ambulatory Visit: Payer: Self-pay

## 2021-08-24 ENCOUNTER — Ambulatory Visit: Payer: BC Managed Care – PPO

## 2021-08-24 DIAGNOSIS — M79672 Pain in left foot: Secondary | ICD-10-CM

## 2021-08-24 DIAGNOSIS — R2689 Other abnormalities of gait and mobility: Secondary | ICD-10-CM

## 2021-08-24 DIAGNOSIS — M6281 Muscle weakness (generalized): Secondary | ICD-10-CM

## 2021-08-24 NOTE — Therapy (Signed)
[facility] Outpatient Rehabilitation [facility] [address] [place], [place], [address] Phone: [phone]   Fax:  [phone]  Physical Therapy Treatment  Patient Details  Name: Tiffany Mullins MRN: [id number] Date of Birth: [birth year] Referring Provider (PT): Edwin Cap, [place]   Encounter Date: 08/24/2021   PT End of Session - 08/24/21 1529     Visit Number 4    Number of Visits 16    Date for PT Re-Evaluation 09/22/21    Authorization Type BCBS    PT Start Time 1530    PT Stop Time 1610    PT Time Calculation (min) 40 min    Activity Tolerance Patient tolerated treatment well    Behavior During Therapy [facility] for tasks assessed/performed             Past Medical History:  Diagnosis Date   Allergic rhinitis    Carpal tunnel syndrome    GERD (gastroesophageal reflux disease)    Hypertension    Left sided sciatica    Morbid obesity (HCC)    Vitamin D deficiency     Past Surgical History:  Procedure Laterality Date   BREAST BIOPSY Left     There were no vitals filed for this visit.   Subjective Assessment - 08/24/21 1529     Subjective Pt presents to PT with reports of continued L foot. She also c/o LBP when standing, roughly 9/10 today. She has been compliant with HEP with no adverse effect. She is ready to begin PT at this time.    Currently in Pain? Yes    Pain Score 5     Pain Location Foot    Pain Orientation Left           [name]:   Therapeutic Exercise:  NuStep lvl 5 UE/LE while taking subjective STS 2x10 - no UE support Fwd physioball rollout x 10  Hamstring curl 45lb 2x10 Knee ext 2x10 15lb Standing L calf stretch 2x20" Standing heel raise 2x10 BAPS circles CW/CCW 2x10 ea L L3 L ankle DF/inv/ev 3x10 RTB L ankle PF 3x10 blue TB  Past Exercises Not Performed Today:  Towel crunches 2x30 sec Towel inv/ev x 5 L foot ABC L foot/ankle x 1  Manual Therapy: N/A   Neuromuscular  re-ed: N/A   Therapeutic Activity: N/A   Modalities: N/A   Self Care: N/A   Consider / progression for next session:                              PT Education - 08/24/21 1606     Education Details HEP update    Person(s) Educated Patient    Methods Explanation;Demonstration;Handout    Comprehension Verbalized understanding;Returned demonstration              PT Short Term Goals - 07/28/21 1539       PT SHORT TERM GOAL #1   Title Tiffany Mullins will be >75% HEP compliant to improve carryover between sessions and facilitate independent management of condition    Target Date 08/18/21               PT Long Term Goals - 07/28/21 1540       PT LONG TERM GOAL #1   Title Tiffany Mullins will improve 10 meter max gait speed to .8 m/s (.1 m/s MCID) to show functional improvement in ambulation  EVAL: .5 m/s  target date:  09/22/21      PT LONG TERM GOAL #2   Title Tiffany Mullins will improve 30'' STS (MCID 2) to >/= 12x (w/ UE: N) to show improved LE strength and improved transfers  EVAL: 10x  w/ UE? Y   target date: 09/22/21      PT LONG TERM GOAL #3   Title Tiffany Mullins will improve FOTO score from 55 (on evaluation) to 61 as a proxy for functional improvement  target date: 09/22/21      PT LONG TERM GOAL #4   Title Tiffany Mullins will be able to stand for >30'' in tandem stance, to show a significant improvement in balance in order to reduce fall risk  EVAL: unable  target date: 09/22/21      PT LONG TERM GOAL #5   Title Tiffany Mullins will achieve greater than 40 degrees of closed chain left ankle DF of the affected ankle (inclinometer placed just distal to tibial tuberosity; "zeroed" vertically; 1/2 kneeling if able; measured on forward leg  ideal is between 40 - 50 degrees) to normalize terminal stance phase of gait and reduce stress on posterior ankle structures  EVAL: n/t d/t precautions but shows  deficit in DF L>R  target date: 09/22/21                   Plan - 08/24/21 1535     Clinical Impression Statement Pt was able to complete prescribed exercises with improved tolerance and more standing activity today. Therapy again focused on improving L ankle strength/mobility as well as previously mentioned LE strengthening and standing progression. She continues to benefit from skilled PT services, as she has continued strength and gait deficits related to L foot. PT will continue to progress as tolerated per POC.    PT Treatment/Interventions ADLs/Self Care Home Management;Aquatic Therapy;Iontophoresis 4mg /ml Dexamethasone;Electrical Stimulation;Gait training;Therapeutic activities;Therapeutic exercise;Neuromuscular re-education;Manual techniques;Dry needling;Vasopneumatic Device;Spinal Manipulations;Joint Manipulations    PT Next Visit Plan progressive WB/strength/balance/gait as appropriate    PT Home Exercise Plan [id number]             Patient will benefit from skilled therapeutic intervention in order to improve the following deficits and impairments:  Abnormal gait, Decreased balance, Decreased endurance, Difficulty walking, Pain, Decreased strength, Decreased range of motion  Visit Diagnosis: Pain in left foot  Muscle weakness  Balance problem  Other abnormalities of gait and mobility     Problem List Patient Active Problem List   Diagnosis Date Noted   Leg edema, left 12/29/2016   Varicose veins of leg with pain, left 12/29/2016   Varicose veins with pain 12/29/2016   Chronic venous insufficiency 07/17/2014   Morbid obesity due to excess calories (HCC) 07/17/2014    07/19/2014, PT 08/24/2021, 4:12 PM  [facility] Outpatient Rehabilitation [facility] [address] [place], [place], [place] Phone: [phone]   Fax:  [phone]  Name: Tiffany Mullins MRN: Tiffany Mullins Date of Birth: 06-21-1966

## 2021-08-25 ENCOUNTER — Ambulatory Visit: Payer: BC Managed Care – PPO | Admitting: Physical Therapy

## 2021-08-26 ENCOUNTER — Other Ambulatory Visit: Payer: Self-pay

## 2021-08-26 ENCOUNTER — Ambulatory Visit: Payer: BC Managed Care – PPO | Attending: Podiatry

## 2021-08-26 DIAGNOSIS — M6281 Muscle weakness (generalized): Secondary | ICD-10-CM | POA: Insufficient documentation

## 2021-08-26 DIAGNOSIS — R2689 Other abnormalities of gait and mobility: Secondary | ICD-10-CM | POA: Insufficient documentation

## 2021-08-26 DIAGNOSIS — M79672 Pain in left foot: Secondary | ICD-10-CM | POA: Insufficient documentation

## 2021-08-26 NOTE — Therapy (Signed)
[facility] Outpatient Rehabilitation [facility] [address] [place], [place], [address] Phone: [phone]   Fax:  [phone]  Physical Therapy Treatment  Patient Details  Name: Tiffany Mullins MRN: [id number] Date of Birth: 06/02/1966 Referring Provider (PT): Edwin Cap, [place]   Encounter Date: 08/26/2021   PT End of Session - 08/26/21 1652     Visit Number 5    Number of Visits 16    Date for PT Re-Evaluation 09/22/21    Authorization Type BCBS    PT Start Time 1653    PT Stop Time 1735    PT Time Calculation (min) 42 min    Activity Tolerance Patient tolerated treatment well    Behavior During Therapy [facility] for tasks assessed/performed             Past Medical History:  Diagnosis Date   Allergic rhinitis    Carpal tunnel syndrome    GERD (gastroesophageal reflux disease)    Hypertension    Left sided sciatica    Morbid obesity (HCC)    Vitamin D deficiency     Past Surgical History:  Procedure Laterality Date   BREAST BIOPSY Left     There were no vitals filed for this visit.   Subjective Assessment - 08/26/21 1653     Subjective Pt presents to PT with reports of increased L foot pain and discomfort after trying to wear different shoes. She has been compliant with HEP with no adverse effect. Pt is ready to begin PT at this time.    Currently in Pain? Yes    Pain Score 8     Pain Location Foot    Pain Orientation Left           [name]:   Therapeutic Exercise:  NuStep lvl 5 UE/LE while taking subjective STS 2x10 - no UE support Hamstring curl 45lb 2x10 Knee ext 2x10 5lb L only Standing L calf stretch 2x20" Standing heel raise 2x10 BAPS circles CW/CCW 2x10 ea L L3 L ankle DF/inv/ev 3x15 RTB L ankle PF 3x15 black TB Wobble board fwd/bwd 2x10   Past Exercises Not Performed Today:  Towel crunches 2x30 sec Towel inv/ev x 5 L foot ABC L foot/ankle x 1   Manual Therapy: N/A   Neuromuscular  re-ed: N/A   Therapeutic Activity: N/A   Modalities: N/A   Self Care: N/A   Consider / progression for next session:                                PT Short Term Goals - 07/28/21 1539       PT SHORT TERM GOAL #1   Title Tiffany Mullins will be >75% HEP compliant to improve carryover between sessions and facilitate independent management of condition    Target Date 08/18/21               PT Long Term Goals - 07/28/21 1540       PT LONG TERM GOAL #1   Title Tiffany Mullins will improve 10 meter max gait speed to .8 m/s (.1 m/s MCID) to show functional improvement in ambulation  EVAL: .5 m/s  target date: 09/22/21      PT LONG TERM GOAL #2   Title Tiffany Mullins will improve 30'' STS (MCID 2) to >/= 12x (w/ UE: N) to show improved LE strength and improved transfers  EVAL: 10x  w/ UE? Y   target date: 09/22/21      PT LONG TERM GOAL #3   Title Tiffany Mullins will improve FOTO score from 55 (on evaluation) to 38 as a proxy for functional improvement  target date: 09/22/21      PT LONG TERM GOAL #4   Title Tiffany Mullins will be able to stand for >30'' in tandem stance, to show a significant improvement in balance in order to reduce fall risk  EVAL: unable  target date: 09/22/21      PT LONG TERM GOAL #5   Title Tiffany Mullins will achieve greater than 40 degrees of closed chain left ankle DF of the affected ankle (inclinometer placed just distal to tibial tuberosity; "zeroed" vertically; 1/2 kneeling if able; measured on forward leg  ideal is between 40 - 50 degrees) to normalize terminal stance phase of gait and reduce stress on posterior ankle structures  EVAL: n/t d/t precautions but shows deficit in DF L>R  target date: 09/22/21                   Plan - 08/26/21 1736     Clinical Impression Statement Pt was able to complete prescribed exercises with no adverse effect or increase in pain. Therapy  today focused on improving LE strength and L ankle mobility/proprioception. She is progressing as expected with therapy, but does have lingering pain and gait deficits that are limiting her ability to return to PLOF. She would benefit from continued PT services and will continue to be seen and progressed as tolerated.    PT Next Visit Plan progressive WB/strength/balance/gait as appropriate    PT Home Exercise Plan [id number]             Patient will benefit from skilled therapeutic intervention in order to improve the following deficits and impairments:  Abnormal gait, Decreased balance, Decreased endurance, Difficulty walking, Pain, Decreased strength, Decreased range of motion  Visit Diagnosis: Pain in left foot  Muscle weakness  Balance problem  Other abnormalities of gait and mobility     Problem List Patient Active Problem List   Diagnosis Date Noted   Leg edema, left 12/29/2016   Varicose veins of leg with pain, left 12/29/2016   Varicose veins with pain 12/29/2016   Chronic venous insufficiency 07/17/2014   Morbid obesity due to excess calories (HCC) 07/17/2014    Eloy End, PT 08/26/2021, 5:38 PM  [facility] Outpatient Rehabilitation [facility] [address] [place], [place], [address] Phone: [phone]   Fax:  [phone]  Name: Tiffany Mullins MRN: [id number] Date of Birth: 1966/04/12

## 2021-08-30 ENCOUNTER — Telehealth: Payer: Self-pay

## 2021-08-30 ENCOUNTER — Ambulatory Visit: Payer: BC Managed Care – PPO

## 2021-08-30 NOTE — Telephone Encounter (Signed)
PT called and left voicemail regarding missed visit and reminder of next appointment.  Eloy End, PT, DPT 08/30/21 5:41 PM

## 2021-08-31 ENCOUNTER — Ambulatory Visit: Payer: BC Managed Care – PPO

## 2021-08-31 ENCOUNTER — Other Ambulatory Visit: Payer: Self-pay

## 2021-08-31 DIAGNOSIS — M6281 Muscle weakness (generalized): Secondary | ICD-10-CM

## 2021-08-31 DIAGNOSIS — M79672 Pain in left foot: Secondary | ICD-10-CM | POA: Diagnosis not present

## 2021-08-31 DIAGNOSIS — R2689 Other abnormalities of gait and mobility: Secondary | ICD-10-CM

## 2021-08-31 NOTE — Therapy (Signed)
[facility] Outpatient Rehabilitation [facility] [address] [place], [place], [address] Phone: [phone]   Fax:  [phone]  Physical Therapy Treatment  Patient Details  Name: Tiffany Mullins MRN: [id number] Date of Birth: 11/11/1965 Referring Provider (PT): Edwin Cap, [place]   Encounter Date: 08/31/2021   PT End of Session - 08/31/21 1741     Visit Number 6    Number of Visits 16    Date for PT Re-Evaluation 09/22/21    Authorization Type BCBS    PT Start Time 1741    PT Stop Time 1832    PT Time Calculation (min) 51 min    Activity Tolerance Patient tolerated treatment well    Behavior During Therapy [facility] for tasks assessed/performed             Past Medical History:  Diagnosis Date   Allergic rhinitis    Carpal tunnel syndrome    GERD (gastroesophageal reflux disease)    Hypertension    Left sided sciatica    Morbid obesity (HCC)    Vitamin D deficiency     Past Surgical History:  Procedure Laterality Date   BREAST BIOPSY Left     There were no vitals filed for this visit.   Subjective Assessment - 08/31/21 1741     Subjective Pt presents to PT with continued L foot pain. Has been compliant with HEP with no adverse effects, notes stretches improve symptoms. Is ready to begin PT treatment at this time.    Currently in Pain? Yes    Pain Score 3     Pain Location Foot    Pain Orientation Left           [name]:   Therapeutic Exercise:  NuStep lvl 5 UE/LE while taking subjective STS 2x10 - no UE support Hamstring curl 50lb 3x10 Knee ext 3x10 5lb L only Leg press L only 2x8 35lb Step up 2x10 ea 6in Standing L calf stretch against wall 2x30" Standing heel-toe raise 2x15 BAPS circles CW/CCW 2x15 ea L L3 L ankle inv/ev 3x15 RTB L ankle DF/PF 3x15 blueTB   Past Exercises Not Performed Today:  Towel crunches 2x30 sec Towel inv/ev x 5 L foot ABC L foot/ankle x 1 Wobble board fwd/bwd 2x10    Manual Therapy: AP glide to L ankle for improving DF grade III   Neuromuscular re-ed: N/A   Therapeutic Activity: N/A   Modalities: N/A   Self Care: N/A   Consider / progression for next session:      [facility] PT Assessment - 08/31/21 0001       AROM   Left Ankle Dorsiflexion 9   post manual                                     PT Short Term Goals - 07/28/21 1539       PT SHORT TERM GOAL #1   Title Tiffany Mullins will be >75% HEP compliant to improve carryover between sessions and facilitate independent management of condition    Target Date 08/18/21               PT Long Term Goals - 07/28/21 1540       PT LONG TERM GOAL #1   Title Tiffany Mullins will improve 10 meter max gait speed to .8 m/s (.1 m/s MCID) to show functional improvement in ambulation  EVAL: .5 m/s  target date: 09/22/21      PT LONG TERM GOAL #2   Title Tiffany Mullins will improve 30'' STS (MCID 2) to >/= 12x (w/ UE: N) to show improved LE strength and improved transfers  EVAL: 10x  w/ UE? Y   target date: 09/22/21      PT LONG TERM GOAL #3   Title Tiffany Mullins will improve FOTO score from 55 (on evaluation) to 60 as a proxy for functional improvement  target date: 09/22/21      PT LONG TERM GOAL #4   Title Tiffany Mullins will be able to stand for >30'' in tandem stance, to show a significant improvement in balance in order to reduce fall risk  EVAL: unable  target date: 09/22/21      PT LONG TERM GOAL #5   Title Tiffany Mullins will achieve greater than 40 degrees of closed chain left ankle DF of the affected ankle (inclinometer placed just distal to tibial tuberosity; "zeroed" vertically; 1/2 kneeling if able; measured on forward leg  ideal is between 40 - 50 degrees) to normalize terminal stance phase of gait and reduce stress on posterior ankle structures  EVAL: n/t d/t precautions but shows deficit in DF L>R  target date:  09/22/21                   Plan - 08/31/21 1756     Clinical Impression Statement Pt was able to complete all prescribed exercises with improved tolerance to progression. Therapy again focused today on improving LE strength and L ankle motor control/ROM. She demonstrated improved L ankle DF post manual, increasing from 7 to 9 degrees. She continues to benefit from skilled PT services and will continue to be seen and progressed as tolerated.    PT Treatment/Interventions ADLs/Self Care Home Management;Aquatic Therapy;Iontophoresis 4mg /ml Dexamethasone;Electrical Stimulation;Gait training;Therapeutic activities;Therapeutic exercise;Neuromuscular re-education;Manual techniques;Dry needling;Vasopneumatic Device;Spinal Manipulations;Joint Manipulations    PT Next Visit Plan progressive WB/strength/balance/gait as appropriate    PT Home Exercise Plan [id number]             Patient will benefit from skilled therapeutic intervention in order to improve the following deficits and impairments:  Abnormal gait, Decreased balance, Decreased endurance, Difficulty walking, Pain, Decreased strength, Decreased range of motion  Visit Diagnosis: Pain in left foot  Muscle weakness  Balance problem  Other abnormalities of gait and mobility     Problem List Patient Active Problem List   Diagnosis Date Noted   Leg edema, left 12/29/2016   Varicose veins of leg with pain, left 12/29/2016   Varicose veins with pain 12/29/2016   Chronic venous insufficiency 07/17/2014   Morbid obesity due to excess calories (HCC) 07/17/2014    07/19/2014, PT 08/31/2021, 6:38 PM  [facility] Outpatient Rehabilitation [facility] [address] [place], [place], [place] Phone: [phone]   Fax:  [phone]  Name: Tiffany Mullins MRN: [name] Date of Birth: Jan 27, 1966

## 2021-09-02 ENCOUNTER — Ambulatory Visit: Payer: BC Managed Care – PPO

## 2021-09-02 ENCOUNTER — Other Ambulatory Visit: Payer: Self-pay

## 2021-09-02 DIAGNOSIS — M79672 Pain in left foot: Secondary | ICD-10-CM | POA: Diagnosis not present

## 2021-09-02 DIAGNOSIS — M6281 Muscle weakness (generalized): Secondary | ICD-10-CM

## 2021-09-02 DIAGNOSIS — R2689 Other abnormalities of gait and mobility: Secondary | ICD-10-CM

## 2021-09-02 NOTE — Therapy (Signed)
[facility] Outpatient Rehabilitation [facility] [address] [place], [place], [address] Phone: [phone]   Fax:  [phone]  Physical Therapy Treatment  Patient Details  Name: Tiffany Mullins MRN: [id number] Date of Birth: 18-Jul-1966 Referring Provider (PT): Edwin Cap, [place]   Encounter Date: 09/02/2021   PT End of Session - 09/02/21 1700     Visit Number 7    Number of Visits 16    Date for PT Re-Evaluation 09/22/21    Authorization Type BCBS    PT Start Time 1700    PT Stop Time 1742    PT Time Calculation (min) 42 min    Activity Tolerance Patient tolerated treatment well    Behavior During Therapy [facility] for tasks assessed/performed             Past Medical History:  Diagnosis Date   Allergic rhinitis    Carpal tunnel syndrome    GERD (gastroesophageal reflux disease)    Hypertension    Left sided sciatica    Morbid obesity (HCC)    Vitamin D deficiency     Past Surgical History:  Procedure Laterality Date   BREAST BIOPSY Left     There were no vitals filed for this visit.   Subjective Assessment - 09/02/21 1700     Subjective Pt presents to PT with continued L foot/ankle pain and discomfort. Has been compliant with HEP with no adverse effect. Pt is ready to begin PT at this time.    Currently in Pain? Yes    Pain Score 2     Pain Location Foot    Pain Orientation Left           [name]:   Therapeutic Exercise:  NuStep lvl 5 UE/LE while taking subjective STS 2x10 - 10lb KB Hamstring curl 25lb 2x10 L only Knee ext 3x10 10lb L only Leg press L only 2x10 35lb Step up 2x10 ea 8in Standing L calf stretch against wall x30" Standing L soleus stretch x30"  Slant board stretch 2x30" Standing heel-toe raise 2x15   Past Exercises Not Performed Today:  Towel crunches 2x30 sec Towel inv/ev x 5 L foot ABC L foot/ankle x 1 Wobble board fwd/bwd 2x10 BAPS circles CW/CCW 2x15 ea L L3 L ankle inv/ev  3x15 RTB L ankle DF/PF 3x15 blueTB   Manual Therapy: AP glide to L ankle for improving DF grade III STM to L lateral ankle for decreasing edema   Neuromuscular re-ed: N/A   Therapeutic Activity: N/A   Modalities: N/A   Self Care: N/A   Consider / progression for next session:                                PT Short Term Goals - 07/28/21 1539       PT SHORT TERM GOAL #1   Title Tiffany Mullins will be >75% HEP compliant to improve carryover between sessions and facilitate independent management of condition    Target Date 08/18/21               PT Long Term Goals - 07/28/21 1540       PT LONG TERM GOAL #1   Title Tiffany Mullins will improve 10 meter max gait speed to .8 m/s (.1 m/s MCID) to show functional improvement in ambulation  EVAL: .5 m/s  target date: 09/22/21      PT LONG TERM  GOAL #2   Title Tiffany Mullins will improve 30'' STS (MCID 2) to >/= 12x (w/ UE: N) to show improved LE strength and improved transfers  EVAL: 10x  w/ UE? Y   target date: 09/22/21      PT LONG TERM GOAL #3   Title Tiffany Mullins will improve FOTO score from 55 (on evaluation) to 24 as a proxy for functional improvement  target date: 09/22/21      PT LONG TERM GOAL #4   Title Tiffany Mullins will be able to stand for >30'' in tandem stance, to show a significant improvement in balance in order to reduce fall risk  EVAL: unable  target date: 09/22/21      PT LONG TERM GOAL #5   Title Tiffany Mullins will achieve greater than 40 degrees of closed chain left ankle DF of the affected ankle (inclinometer placed just distal to tibial tuberosity; "zeroed" vertically; 1/2 kneeling if able; measured on forward leg  ideal is between 40 - 50 degrees) to normalize terminal stance phase of gait and reduce stress on posterior ankle structures  EVAL: n/t d/t precautions but shows deficit in DF L>R  target date: 09/22/21                    Plan - 09/02/21 1704     Clinical Impression Statement Pt was able to complete prescribed exercises with no adverse effect. Therapy continued to focus on improving LE strength and L ankle ROM in order to decrease pain and improve gait. She responded well to manual therapy, noting decreased discomfort and demonstrating improved gait post session. She conitnue to benefit from skilled PT and will continue to be seen and progressed as able.    PT Treatment/Interventions ADLs/Self Care Home Management;Aquatic Therapy;Iontophoresis 4mg /ml Dexamethasone;Electrical Stimulation;Gait training;Therapeutic activities;Therapeutic exercise;Neuromuscular re-education;Manual techniques;Dry needling;Vasopneumatic Device;Spinal Manipulations;Joint Manipulations    PT Next Visit Plan progressive WB/strength/balance/gait as appropriate    PT Home Exercise Plan [id number]             Patient will benefit from skilled therapeutic intervention in order to improve the following deficits and impairments:  Abnormal gait, Decreased balance, Decreased endurance, Difficulty walking, Pain, Decreased strength, Decreased range of motion  Visit Diagnosis: Pain in left foot  Muscle weakness  Balance problem  Other abnormalities of gait and mobility     Problem List Patient Active Problem List   Diagnosis Date Noted   Leg edema, left 12/29/2016   Varicose veins of leg with pain, left 12/29/2016   Varicose veins with pain 12/29/2016   Chronic venous insufficiency 07/17/2014   Morbid obesity due to excess calories (HCC) 07/17/2014    07/19/2014, PT 09/02/2021, 5:44 PM  [facility] Outpatient Rehabilitation [facility] [address] [place], [place], [place] Phone: [phone]   Fax:  [phone]  Name: Tiffany Mullins MRN: Tiffany Mullins Date of Birth: [birth year]

## 2021-09-07 ENCOUNTER — Other Ambulatory Visit: Payer: Self-pay

## 2021-09-07 ENCOUNTER — Ambulatory Visit: Payer: BC Managed Care – PPO

## 2021-09-07 DIAGNOSIS — M6281 Muscle weakness (generalized): Secondary | ICD-10-CM

## 2021-09-07 DIAGNOSIS — M79672 Pain in left foot: Secondary | ICD-10-CM | POA: Diagnosis not present

## 2021-09-07 DIAGNOSIS — R2689 Other abnormalities of gait and mobility: Secondary | ICD-10-CM

## 2021-09-07 NOTE — Therapy (Signed)
[facility] Outpatient Rehabilitation [facility] [address] [place], [place], [address] Phone: [phone]   Fax:  [phone]  Physical Therapy Treatment  Patient Details  Name: Tiffany Mullins MRN: [id number] Date of Birth: 11-30-1965 Referring Provider (PT): Edwin Cap, [place]   Encounter Date: 09/07/2021   PT End of Session - 09/07/21 1618     Visit Number 8    Number of Visits 16    Date for PT Re-Evaluation 09/22/21    Authorization Type BCBS    PT Start Time 1616    PT Stop Time 1655    PT Time Calculation (min) 39 min    Activity Tolerance Patient tolerated treatment well    Behavior During Therapy WFL for tasks assessed/performed             Past Medical History:  Diagnosis Date   Allergic rhinitis    Carpal tunnel syndrome    GERD (gastroesophageal reflux disease)    Hypertension    Left sided sciatica    Morbid obesity (HCC)    Vitamin D deficiency     Past Surgical History:  Procedure Laterality Date   BREAST BIOPSY Left     There were no vitals filed for this visit.   Subjective Assessment - 09/07/21 1618     Subjective Pt presents to PT with increased L foot/ankle pain. Had to go back to using a crutch over the weekend due to increased pain. Has been compliant with HEP with no adverse effect. Pt is ready to begin PT at this time.    Currently in Pain? Yes    Pain Score 6     Pain Location Foot    Pain Orientation Left           [name]:   Therapeutic Exercise:  NuStep lvl 5 UE/LE while taking subjective STS 2x10 - 10lb KB - 2nd set L foot back Hamstring curl 25lb 2x10 L only Knee ext 3x10 10lb L only Leg press L only 2x10 35lb Cybex hip abd/ext 2x10 ea 25lb Standing L calf stretch against wall 2x30" Standing L soleus stretch 2x30"  Slant board stretch 2x30" Standing heel-toe raise 2x15 BAPS circles CW/CCW x 20 ea L L3   Past Exercises Not Performed Today:  L ankle inv/ev 3x15  RTB L ankle DF/PF 3x15 blueTB Step up 2x10 ea 8in   Manual Therapy: N/A   Neuromuscular re-ed: N/A   Therapeutic Activity: N/A   Modalities: N/A   Self Care: N/A   Consider / progression for next session:                                PT Short Term Goals - 07/28/21 1539       PT SHORT TERM GOAL #1   Title [name] will be >75% HEP compliant to improve carryover between sessions and facilitate independent management of condition    Target Date 08/18/21               PT Long Term Goals - 07/28/21 1540       PT LONG TERM GOAL #1   Title [name] will improve 10 meter max gait speed to .8 m/s (.1 m/s MCID) to show functional improvement in ambulation    EVAL: .5 m/s    target date: 09/22/21      PT LONG TERM GOAL #2   Title [name]  G Molenda will improve 30'' STS (MCID 2) to >/= 12x (w/ UE: N) to show improved LE strength and improved transfers    EVAL: 10x  w/ UE? Y     target date: 09/22/21      PT LONG TERM GOAL #3   Title [name] will improve FOTO score from 55 (on evaluation) to 62 as a proxy for functional improvement    target date: 09/22/21      PT LONG TERM GOAL #4   Title [name] will be able to stand for >30'' in tandem stance, to show a significant improvement in balance in order to reduce fall risk    EVAL: unable    target date: 09/22/21      PT LONG TERM GOAL #5   Title [name] will achieve greater than 40 degrees of closed chain left ankle DF of the affected ankle (inclinometer placed just distal to tibial tuberosity; "zeroed" vertically; 1/2 kneeling if able; measured on forward leg    ideal is between 40 - 50 degrees) to normalize terminal stance phase of gait and reduce stress on posterior ankle structures    EVAL: n/t d/t precautions but shows deficit in DF L>R    target date: 09/22/21                   Plan - 09/07/21 1620     Clinical Impression Statement Pt  was able to complete prescribed exercises with no adverse effect. Therapy today continued to work on improving LE strength and L ankle stability/ROM. She continues to note decreased pain post session along with visble improvement in gait noted by PT. Overall she continues to progress well and should continue to be seen per POC as prescribed.    PT Treatment/Interventions ADLs/Self Care Home Management;Aquatic Therapy;Iontophoresis 4mg /ml Dexamethasone;Electrical Stimulation;Gait training;Therapeutic activities;Therapeutic exercise;Neuromuscular re-education;Manual techniques;Dry needling;Vasopneumatic Device;Spinal Manipulations;Joint Manipulations    PT Next Visit Plan progressive WB/strength/balance/gait as appropriate    PT Home Exercise Plan [id number]             Patient will benefit from skilled therapeutic intervention in order to improve the following deficits and impairments:  Abnormal gait, Decreased balance, Decreased endurance, Difficulty walking, Pain, Decreased strength, Decreased range of motion  Visit Diagnosis: Pain in left foot  Muscle weakness  Balance problem  Other abnormalities of gait and mobility     Problem List Patient Active Problem List   Diagnosis Date Noted   Leg edema, left 12/29/2016   Varicose veins of leg with pain, left 12/29/2016   Varicose veins with pain 12/29/2016   Chronic venous insufficiency 07/17/2014   Morbid obesity due to excess calories (HCC) 07/17/2014    07/19/2014, PT 09/07/2021, 4:59 PM  [facility] Rehabilitation [facility] [address] [place], [place], [place] Phone: [phone]   Fax:  [phone]  Name: Tiffany Mullins MRN: [name] Date of Birth: 03/28/1966

## 2021-09-10 ENCOUNTER — Ambulatory Visit: Payer: BC Managed Care – PPO | Admitting: Physical Therapy

## 2021-09-10 ENCOUNTER — Other Ambulatory Visit: Payer: Self-pay

## 2021-09-10 ENCOUNTER — Encounter: Payer: Self-pay | Admitting: Physical Therapy

## 2021-09-10 DIAGNOSIS — M6281 Muscle weakness (generalized): Secondary | ICD-10-CM

## 2021-09-10 DIAGNOSIS — R2689 Other abnormalities of gait and mobility: Secondary | ICD-10-CM

## 2021-09-10 DIAGNOSIS — M79672 Pain in left foot: Secondary | ICD-10-CM | POA: Diagnosis not present

## 2021-09-10 NOTE — Therapy (Signed)
[facility] Outpatient Rehabilitation [facility] [address] [place], [place], [address] Phone: [phone]   Fax:  [phone]  Physical Therapy Treatment  Patient Details  Name: Tiffany Mullins MRN: [id number] Date of Birth: 12-Dec-1965 Referring Provider (PT): Edwin Cap, [place]   Encounter Date: 09/10/2021   PT End of Session - 09/10/21 1259     Visit Number 9    Number of Visits 16    Date for PT Re-Evaluation 09/22/21    Authorization Type BCBS    PT Start Time 1300    PT Stop Time 1345    PT Time Calculation (min) 45 min    Activity Tolerance Patient tolerated treatment well    Behavior During Therapy [name]             Past Medical History:  Diagnosis Date   Allergic rhinitis    Carpal tunnel syndrome    GERD (gastroesophageal reflux disease)    Hypertension    Left sided sciatica    Morbid obesity (HCC)    Vitamin D deficiency     Past Surgical History:  Procedure Laterality Date   BREAST BIOPSY Left     There were no vitals filed for this visit.   Subjective Assessment - 09/10/21 1303     Subjective Pt reports that she has been trying to get out and do more in the community.  She went shopping at [employer] yesterday.  This went well at the time, but she is sore today.  6/10 L foot pain today.             [name]:   Therapeutic Exercise:  NuStep lvl 5 UE/LE while taking subjective STS 2x10 - 10lb KB - 2nd set L foot back Hamstring curl 25lb 3x10 L only Knee ext 3x10 10lb L only Leg press L only 3x10 45lb Cybex hip abd/ext 3x10 ea 31lb Standing L calf stretch against wall 2x30" Standing L soleus stretch 2x30"  Slant board stretch 3x45" Standing heel-toe raise 2x15 (NT) BAPS circles CW/CCW DF/PF Inv/Ev x 20 ea L L3   Past Exercises Not Performed Today:  L ankle inv/ev 3x15 RTB L ankle DF/PF 3x15 blueTB Step up 2x10 ea 8in  Neuromuscular re-ed, to improve  balance and reduce fall risk: - tandem stance - bouts of 30''    PT Short Term Goals - 09/10/21 1306       PT SHORT TERM GOAL #1   Title Tiffany Mullins will be >75% HEP compliant to improve carryover between sessions and facilitate independent management of condition    Status Achieved    Target Date 08/18/21               PT Long Term Goals - 07/28/21 1540       PT LONG TERM GOAL #1   Title Tiffany Mullins will improve 10 meter max gait speed to .8 m/s (.1 m/s MCID) to show functional improvement in ambulation    EVAL: .5 m/s    target date: 09/22/21      PT LONG TERM GOAL #2   Title Tiffany Mullins will improve 30'' STS (MCID 2) to >/= 12x (w/ UE: N) to show improved LE strength and improved transfers    EVAL: 10x  w/ UE? Y     target date: 09/22/21      PT LONG TERM GOAL #3   Title Tiffany Mullins will improve FOTO score from 55 (  on evaluation) to 38 as a proxy for functional improvement    target date: 09/22/21      PT LONG TERM GOAL #4   Title Tiffany Mullins will be able to stand for >30'' in tandem stance, to show a significant improvement in balance in order to reduce fall risk    EVAL: unable    target date: 09/22/21      PT LONG TERM GOAL #5   Title Tiffany Mullins will achieve greater than 40 degrees of closed chain left ankle DF of the affected ankle (inclinometer placed just distal to tibial tuberosity; "zeroed" vertically; 1/2 kneeling if able; measured on forward leg    ideal is between 40 - 50 degrees) to normalize terminal stance phase of gait and reduce stress on posterior ankle structures    EVAL: n/t d/t precautions but shows deficit in DF L>R    target date: 09/22/21                   Plan - 09/10/21 1344     Clinical Impression Statement Tiffany Mullins is progressing fair with therapy.  Pt reports a mild increase in pain following therapy.  Today we concentrated on ankle strengthening and ankle range of motion.  Pt with higher levels of  baseline pain today.  She was unable to tolerate much standing exercise, so we concentrated on general LE strengthening and ankle ROM.  Her ankle ROM seems to be progressing nicely.  Pt will continue to benefit from skilled physical therapy to address remaining deficits and achieve listed goals.  Continue per POC.    PT Treatment/Interventions ADLs/Self Care Home Management;Aquatic Therapy;Iontophoresis 4mg /ml Dexamethasone;Electrical Stimulation;Gait training;Therapeutic activities;Therapeutic exercise;Neuromuscular re-education;Manual techniques;Dry needling;Vasopneumatic Device;Spinal Manipulations;Joint Manipulations    PT Next Visit Plan progressive WB/strength/balance/gait as appropriate    PT Home Exercise Plan [id number]             Patient will benefit from skilled therapeutic intervention in order to improve the following deficits and impairments:  Abnormal gait, Decreased balance, Decreased endurance, Difficulty walking, Pain, Decreased strength, Decreased range of motion  Visit Diagnosis: Pain in left foot  Muscle weakness  Balance problem  Other abnormalities of gait and mobility     Problem List Patient Active Problem List   Diagnosis Date Noted   Leg edema, left 12/29/2016   Varicose veins of leg with pain, left 12/29/2016   Varicose veins with pain 12/29/2016   Chronic venous insufficiency 07/17/2014   Morbid obesity due to excess calories (HCC) 07/17/2014    07/19/2014, PT 09/10/2021, 1:45 PM  [facility] [address] [place], [place], [place] Phone: [phone]   Fax:  [phone]  Name: Tiffany Mullins MRN: Tiffany Mullins Date of Birth: 1966-05-24

## 2021-09-13 ENCOUNTER — Ambulatory Visit: Payer: BC Managed Care – PPO

## 2021-09-13 ENCOUNTER — Ambulatory Visit (INDEPENDENT_AMBULATORY_CARE_PROVIDER_SITE_OTHER): Payer: BC Managed Care – PPO | Admitting: Podiatry

## 2021-09-13 ENCOUNTER — Other Ambulatory Visit: Payer: Self-pay

## 2021-09-13 ENCOUNTER — Ambulatory Visit (INDEPENDENT_AMBULATORY_CARE_PROVIDER_SITE_OTHER): Payer: BC Managed Care – PPO

## 2021-09-13 DIAGNOSIS — Z9889 Other specified postprocedural states: Secondary | ICD-10-CM | POA: Diagnosis not present

## 2021-09-13 DIAGNOSIS — Q66222 Congenital metatarsus adductus, left foot: Secondary | ICD-10-CM

## 2021-09-13 DIAGNOSIS — M79672 Pain in left foot: Secondary | ICD-10-CM

## 2021-09-13 DIAGNOSIS — S99922D Unspecified injury of left foot, subsequent encounter: Secondary | ICD-10-CM | POA: Diagnosis not present

## 2021-09-13 DIAGNOSIS — R2689 Other abnormalities of gait and mobility: Secondary | ICD-10-CM

## 2021-09-13 DIAGNOSIS — M6281 Muscle weakness (generalized): Secondary | ICD-10-CM

## 2021-09-13 NOTE — Therapy (Signed)
[facility] Outpatient Rehabilitation [facility] [address] [place], [place], [address] Phone: [phone]   Fax:  [phone]  Physical Therapy Treatment  Patient Details  Name: CLARINE ELROD MRN: [id number] Date of Birth: [birth year] Referring Provider (PT): Edwin Cap, [place]   Encounter Date: 09/13/2021   PT End of Session - 09/13/21 1657     Visit Number 10    Number of Visits 16    Date for PT Re-Evaluation 09/22/21    Authorization Type BCBS    PT Start Time 1657    PT Stop Time 1743    PT Time Calculation (min) 46 min    Activity Tolerance Patient tolerated treatment well    Behavior During Therapy [facility] for tasks assessed/performed             Past Medical History:  Diagnosis Date   Allergic rhinitis    Carpal tunnel syndrome    GERD (gastroesophageal reflux disease)    Hypertension    Left sided sciatica    Morbid obesity (HCC)    Vitamin D deficiency     Past Surgical History:  Procedure Laterality Date   BREAST BIOPSY Left     There were no vitals filed for this visit.   Subjective Assessment - 09/13/21 1657     Subjective Pt presents to PT with continued L foot/ankle pain and discomfort. Continues to note that pain increases with prolonged standing activity. Pt is ready to begin PT treatmnet at this time.    Currently in Pain? Yes    Pain Score 7     Pain Location Foot    Pain Orientation Left           [name]:   Therapeutic Exercise: NuStep lvl 5 LE while taking subjective Standing heel-toe raises x20 STS 2x10 - L foot back Standing L calf stretch against wall 2x30" Standing L soleus stretch 2x30"  Slant board stretch 3x45" BAPS circles CW/CCW DF/PF Inv/Ev x 20 ea L L3 Bridge 2x10 -3" Supine SLR 2x10 ea   Past Exercises Not Performed Today:  L ankle inv/ev 3x15 RTB L ankle DF/PF 3x15 blueTB Step up 2x10 ea 8in Hamstring curl 25lb 3x10 L only Knee ext 3x10 10lb L only Leg  press L only 3x10 45lb Cybex hip abd/ext 3x10 ea 30lb  Manual Therapy:  Grade II AP glides to L ankle PROM L ankle DF, PF, inv, ev   Neuromuscular Re-Ed: Tandem stance L back 3x30" Rocker board 2x10 fwd/bwd                               PT Short Term Goals - 09/10/21 1306       PT SHORT TERM GOAL #1   Title [name] will be >75% HEP compliant to improve carryover between sessions and facilitate independent management of condition    Status Achieved    Target Date 08/18/21               PT Long Term Goals - 07/28/21 1540       PT LONG TERM GOAL #1   Title Bobetta G Aldaz will improve 10 meter max gait speed to .8 m/s (.1 m/s MCID) to show functional improvement in ambulation    EVAL: .5 m/s    target date: 09/22/21      PT LONG TERM GOAL #2   Title [name] will improve 30''  STS (MCID 2) to >/= 12x (w/ UE: N) to show improved LE strength and improved transfers    EVAL: 10x  w/ UE? Y     target date: 09/22/21      PT LONG TERM GOAL #3   Title Kalandra G Luu will improve FOTO score from 55 (on evaluation) to 59 as a proxy for functional improvement    target date: 09/22/21      PT LONG TERM GOAL #4   Title Adayah G Senteno will be able to stand for >30'' in tandem stance, to show a significant improvement in balance in order to reduce fall risk    EVAL: unable    target date: 09/22/21      PT LONG TERM GOAL #5   Title Cyd G Daywalt will achieve greater than 40 degrees of closed chain left ankle DF of the affected ankle (inclinometer placed just distal to tibial tuberosity; "zeroed" vertically; 1/2 kneeling if able; measured on forward leg    ideal is between 40 - 50 degrees) to normalize terminal stance phase of gait and reduce stress on posterior ankle structures    EVAL: n/t d/t precautions but shows deficit in DF L>R    target date: 09/22/21                   Plan - 09/13/21 1747     Clinical Impression  Statement Pt was able to complete prescribed exercises with no adverse effect or increase in pain. Therapy focused on improving ankle strength and ROM today, as well as general LE strength. Pt continues to benefit from skilled PT services, as she continue to have gait and mobility deficits from limited ankle ROM and pain. Will continue to progress as tolerated per POC.    PT Treatment/Interventions ADLs/Self Care Home Management;Aquatic Therapy;Iontophoresis 4mg /ml Dexamethasone;Electrical Stimulation;Gait training;Therapeutic activities;Therapeutic exercise;Neuromuscular re-education;Manual techniques;Dry needling;Vasopneumatic Device;Spinal Manipulations;Joint Manipulations    PT Next Visit Plan progressive WB/strength/balance/gait as appropriate    PT Home Exercise Plan [id number]             Patient will benefit from skilled therapeutic intervention in order to improve the following deficits and impairments:  Abnormal gait, Decreased balance, Decreased endurance, Difficulty walking, Pain, Decreased strength, Decreased range of motion  Visit Diagnosis: Pain in left foot  Muscle weakness  Balance problem  Other abnormalities of gait and mobility     Problem List Patient Active Problem List   Diagnosis Date Noted   Leg edema, left 12/29/2016   Varicose veins of leg with pain, left 12/29/2016   Varicose veins with pain 12/29/2016   Chronic venous insufficiency 07/17/2014   Morbid obesity due to excess calories (HCC) 07/17/2014    07/19/2014, PT 09/13/2021, 5:50 PM  [facility] Out[facility] Rehabilitation [facility] [address] [place], [place], [place] Phone: [phone]   Fax:  [phone]  Name: QUINA WILBOURNE MRN: [name] Date of Birth: 1965-11-08

## 2021-09-15 NOTE — Progress Notes (Signed)
°  Subjective:  Patient ID: Tiffany Mullins, female    DOB: Jan 29, 1966,  MRN: [id number]  Chief Complaint  Patient presents with   Foot Swelling      swelling in the L foot      55 y.o. female returns for follow-up of left foot swelling after surgery 05/14/21.  Not having any pain but noticed the foot being more swollen than usual as she is doing more activity  Review of Systems: Negative except as noted in the HPI. Denies N/V/F/Ch.   Objective:  There were no vitals filed for this visit. There is no height or weight on file to calculate BMI. Constitutional Well developed. Well nourished.  Vascular Foot warm and well perfused. Capillary refill normal to all digits.   Neurologic Normal speech. Oriented to person, place, and time. Epicritic sensation to light touch grossly present bilaterally.  Dermatologic Incisions are well-healed and not hypertrophic  Orthopedic: Mild to moderate edema in the dorsal foot and anterior ankle, no pain on range of motion good strength 5 out of 5   Multiple view plain film radiographs: No change in hardware or surgical corrective alignment and good consolidation across the arthrodesis sites Assessment:   1. Injury of plantar plate, left, subsequent encounter   2. Metatarsus adductus of left foot   3. Status post left foot surgery    Plan:  Patient was evaluated and treated and all questions answered.  S/p foot surgery left -I reviewed the x-rays with her and most of her swelling is likely postoperative.  Continue wearing compression stockings she has had an history of chronic venous insufficiency before and previously has worn knee-high stockings but these cut into the back of her leg.  She is going to look into getting measured for thigh-high stockings.  Continue full activity have no restrictions for her as far as her foot goes from the surgical correction  No follow-ups on file.

## 2021-09-16 ENCOUNTER — Other Ambulatory Visit: Payer: Self-pay

## 2021-09-16 ENCOUNTER — Ambulatory Visit: Payer: BC Managed Care – PPO | Admitting: Physical Therapy

## 2021-09-16 ENCOUNTER — Encounter: Payer: Self-pay | Admitting: Physical Therapy

## 2021-09-16 DIAGNOSIS — R2689 Other abnormalities of gait and mobility: Secondary | ICD-10-CM

## 2021-09-16 DIAGNOSIS — M79672 Pain in left foot: Secondary | ICD-10-CM | POA: Diagnosis not present

## 2021-09-16 DIAGNOSIS — M6281 Muscle weakness (generalized): Secondary | ICD-10-CM

## 2021-09-16 NOTE — Therapy (Signed)
[facility] Outpatient Rehabilitation [facility] [address] [place], [place], [address] Phone: [phone]   Fax:  [phone]  Physical Therapy Treatment  Patient Details  Name: Tiffany Mullins MRN: [id number] Date of Birth: 08-19-1966 Referring Provider (PT): Edwin Cap, [place]   Encounter Date: 09/16/2021   PT End of Session - 09/16/21 1658     Visit Number 11    Number of Visits 16    Date for PT Re-Evaluation 09/22/21    Authorization Type BCBS    PT Start Time 1700    PT Stop Time 1743    PT Time Calculation (min) 43 min    Activity Tolerance Patient tolerated treatment well    Behavior During Therapy WFL for tasks assessed/performed             Past Medical History:  Diagnosis Date   Allergic rhinitis    Carpal tunnel syndrome    GERD (gastroesophageal reflux disease)    Hypertension    Left sided sciatica    Morbid obesity (HCC)    Vitamin D deficiency     Past Surgical History:  Procedure Laterality Date   BREAST BIOPSY Left     There were no vitals filed for this visit.   Subjective Assessment - 09/16/21 1703     Subjective Pt reports that she had a very good day yesterday and today, although her foot does get sore by the end of the day.  Her pain is 6/10 today.              [name]:   Therapeutic Exercise: NuStep lvl 8 LE while taking subjective Standing heel-toe raises 2x20 Slant board stretch 3x45" BAPS circles CW/CCW DF/PF Inv/Ev x 20 ea L L4 Cybex hip abd 3x10 ea 37.5lb   Past Exercises Not Performed Today:  L ankle inv/ev 3x15 RTB STS 2x10 - L foot back L ankle DF/PF 3x15 blueTB Step up 2x10 ea 8in Hamstring curl 25lb 3x10 L only Knee ext 3x10 10lb L only Leg press L only 3x10 45lb Standing L calf stretch against wall 2x30" Standing L soleus stretch 2x30"    Manual Therapy:  Grade II AP glides to L ankle (NT) PROM L ankle DF, PF, inv, ev   Neuromuscular Re-Ed: Tandem  stance L back 3x45" Rocker board 2x10 fwd/bwd Semi-tandem on foam - 45'' bouts      PT Short Term Goals - 09/10/21 1306       PT SHORT TERM GOAL #1   Title Tiffany Mullins will be >75% HEP compliant to improve carryover between sessions and facilitate independent management of condition    Status Achieved    Target Date 08/18/21               PT Long Term Goals - 07/28/21 1540       PT LONG TERM GOAL #1   Title Tiffany Mullins will improve 10 meter max gait speed to .8 m/s (.1 m/s MCID) to show functional improvement in ambulation    EVAL: .5 m/s    target date: 09/22/21      PT LONG TERM GOAL #2   Title Tiffany Mullins will improve 30'' STS (MCID 2) to >/= 12x (w/ UE: N) to show improved LE strength and improved transfers    EVAL: 10x  w/ UE? Y     target date: 09/22/21      PT LONG TERM GOAL #3   Title Tiffany Mullins will improve FOTO score from 55 (on evaluation) to 43 as a proxy for functional improvement    target date: 09/22/21      PT LONG TERM GOAL #4   Title Tiffany Mullins will be able to stand for >30'' in tandem stance, to show a significant improvement in balance in order to reduce fall risk    EVAL: unable    target date: 09/22/21      PT LONG TERM GOAL #5   Title Tiffany Mullins will achieve greater than 40 degrees of closed chain left ankle DF of the affected ankle (inclinometer placed just distal to tibial tuberosity; "zeroed" vertically; 1/2 kneeling if able; measured on forward leg    ideal is between 40 - 50 degrees) to normalize terminal stance phase of gait and reduce stress on posterior ankle structures    EVAL: n/t d/t precautions but shows deficit in DF L>R    target date: 09/22/21                   Plan - 09/16/21 1744     Clinical Impression Statement Tiffany Mullins is progressing well with therapy.  Pt reports no increase in baseline pain following therapy.  Today we concentrated on ankle strengthening, ankle range of motion, and  balance/proprioception.  Pt continues to improve standing tolerance.  We advanced to BAPS level 4 today to good effect, indicating improved ankle ROM.  Pt will continue to benefit from skilled physical therapy to address remaining deficits and achieve listed goals.  Continue per POC.    PT Treatment/Interventions ADLs/Self Care Home Management;Aquatic Therapy;Iontophoresis 4mg /ml Dexamethasone;Electrical Stimulation;Gait training;Therapeutic activities;Therapeutic exercise;Neuromuscular re-education;Manual techniques;Dry needling;Vasopneumatic Device;Spinal Manipulations;Joint Manipulations    PT Next Visit Plan progressive WB/strength/balance/gait as appropriate    PT Home Exercise Plan [id number]             Patient will benefit from skilled therapeutic intervention in order to improve the following deficits and impairments:  Abnormal gait, Decreased balance, Decreased endurance, Difficulty walking, Pain, Decreased strength, Decreased range of motion  Visit Diagnosis: Pain in left foot  Muscle weakness  Balance problem     Problem List Patient Active Problem List   Diagnosis Date Noted   Leg edema, left 12/29/2016   Varicose veins of leg with pain, left 12/29/2016   Varicose veins with pain 12/29/2016   Chronic venous insufficiency 07/17/2014   Morbid obesity due to excess calories (HCC) 07/17/2014    07/19/2014, PT 09/16/2021, 5:45 PM  [facility] Outpatient Rehabilitation [facility] [address] [place], [place], [place] Phone: [phone]   Fax:  [phone]  Name: Tiffany Mullins MRN: [name] Date of Birth: [birth year]

## 2021-09-21 ENCOUNTER — Ambulatory Visit: Payer: BC Managed Care – PPO

## 2021-09-21 ENCOUNTER — Encounter: Payer: Self-pay | Admitting: Podiatry

## 2021-09-21 ENCOUNTER — Other Ambulatory Visit: Payer: Self-pay

## 2021-09-21 DIAGNOSIS — M79672 Pain in left foot: Secondary | ICD-10-CM | POA: Diagnosis not present

## 2021-09-21 DIAGNOSIS — R2689 Other abnormalities of gait and mobility: Secondary | ICD-10-CM

## 2021-09-21 DIAGNOSIS — M6281 Muscle weakness (generalized): Secondary | ICD-10-CM

## 2021-09-21 NOTE — Therapy (Signed)
[facility] [address] [place], [place], [address] Phone: [phone]   Fax:  [phone]  Physical Therapy Treatment  Patient Details  Name: Tiffany Mullins MRN: [id number] Date of Birth: June 25, 1966 Referring Provider (PT): Edwin Cap, [place]   Encounter Date: 09/21/2021   PT End of Session - 09/21/21 1130     Visit Number 12    Number of Visits 16    Date for PT Re-Evaluation 09/22/21    Authorization Type BCBS    PT Start Time 1130    PT Stop Time 1210    PT Time Calculation (min) 40 min    Activity Tolerance Patient tolerated treatment well    Behavior During Therapy [facility] for tasks assessed/performed             Past Medical History:  Diagnosis Date   Allergic rhinitis    Carpal tunnel syndrome    GERD (gastroesophageal reflux disease)    Hypertension    Left sided sciatica    Morbid obesity (HCC)    Vitamin D deficiency     Past Surgical History:  Procedure Laterality Date   BREAST BIOPSY Left     There were no vitals filed for this visit.   Subjective Assessment - 09/21/21 1130     Subjective Pt presents with a little soreness in L foot, but overall feels like things are getting better. She has been compliant with HEP with no adverse effect. She is ready to begin PT treatment at this time.    Currently in Pain? Yes    Pain Score 3     Pain Location Foot    Pain Orientation Left           [name]:   Therapeutic Exercise: NuStep lvl 8 LE while taking subjective Standing heel-toe raises 2x20 Slant board stretch 3x45" BAPS circles CW/CCW DF/PF Inv/Ev x 20 ea L L4 Cybex hip abd 2x10 ea 30lb STS 2x10 - L foot back   Past Exercises Not Performed Today:  L ankle inv/ev 3x15 RTB L ankle DF/PF 3x15 blueTB Step up 2x10 ea 8in Hamstring curl 25lb 3x10 L only Knee ext 3x10 10lb L only Leg press L only 3x10 45lb Standing L calf stretch against wall 2x30" Standing  L soleus stretch 2x30"    Manual Therapy:  Grade II AP glides to L ankle (NT) PROM L ankle DF, PF, inv, ev   Neuromuscular Re-Ed: Tandem stance L back 3x45" Rocker board 2x10 fwd/bwd Semi-tandem on foam - 45'' bouts                               PT Short Term Goals - 09/10/21 1306       PT SHORT TERM GOAL #1   Title Kiran G Brant will be >75% HEP compliant to improve carryover between sessions and facilitate independent management of condition    Status Achieved    Target Date 08/18/21               PT Long Term Goals - 07/28/21 1540       PT LONG TERM GOAL #1   Title Anapaula G Althoff will improve 10 meter max gait speed to .8 m/s (.1 m/s MCID) to show functional improvement in ambulation    EVAL: .5 m/s    target date: 09/22/21      PT LONG TERM GOAL #2  Title [employer] will improve 30'' STS (MCID 2) to >/= 12x (w/ UE: N) to show improved LE strength and improved transfers    EVAL: 10x  w/ UE? Y     target date: 09/22/21      PT LONG TERM GOAL #3   Title Ashlyne G Pardoe will improve FOTO score from 55 (on evaluation) to 33 as a proxy for functional improvement    target date: 09/22/21      PT LONG TERM GOAL #4   Title Koren G Morton will be able to stand for >30'' in tandem stance, to show a significant improvement in balance in order to reduce fall risk    EVAL: unable    target date: 09/22/21      PT LONG TERM GOAL #5   Title Tuyet G Beougher will achieve greater than 40 degrees of closed chain left ankle DF of the affected ankle (inclinometer placed just distal to tibial tuberosity; "zeroed" vertically; 1/2 kneeling if able; measured on forward leg    ideal is between 40 - 50 degrees) to normalize terminal stance phase of gait and reduce stress on posterior ankle structures    EVAL: n/t d/t precautions but shows deficit in DF L>R    target date: 09/22/21                   Plan - 09/21/21 1138     Clinical  Impression Statement Pt was again able to complete prescribed exercises, not adverse effect or increase in pain. Therapy today continued to focus on improving L ankle mobility/motor control as well as general LE strength. She has progressed well with PT, with threapy assessing goals and readiness for d/c at next session.    PT Treatment/Interventions ADLs/Self Care Home Management;Aquatic Therapy;Iontophoresis 4mg /ml Dexamethasone;Electrical Stimulation;Gait training;Therapeutic activities;Therapeutic exercise;Neuromuscular re-education;Manual techniques;Dry needling;Vasopneumatic Device;Spinal Manipulations;Joint Manipulations    PT Next Visit Plan assess goals and d/c appropriatness; create final HEP    PT Home Exercise Plan [id number]             Patient will benefit from skilled therapeutic intervention in order to improve the following deficits and impairments:  Abnormal gait, Decreased balance, Decreased endurance, Difficulty walking, Pain, Decreased strength, Decreased range of motion  Visit Diagnosis: Pain in left foot  Muscle weakness  Balance problem  Other abnormalities of gait and mobility     Problem List Patient Active Problem List   Diagnosis Date Noted   Leg edema, left 12/29/2016   Varicose veins of leg with pain, left 12/29/2016   Varicose veins with pain 12/29/2016   Chronic venous insufficiency 07/17/2014   Morbid obesity due to excess calories (HCC) 07/17/2014    07/19/2014, PT 09/21/2021, 12:10 PM  [facility] [address] [place], [place], [place] Phone: [phone]   Fax:  [phone]  Name: Tiffany Mullins MRN: [name] Date of Birth: [birth year]

## 2021-09-22 ENCOUNTER — Ambulatory Visit: Payer: BC Managed Care – PPO

## 2021-09-22 DIAGNOSIS — M79672 Pain in left foot: Secondary | ICD-10-CM

## 2021-09-22 DIAGNOSIS — M6281 Muscle weakness (generalized): Secondary | ICD-10-CM

## 2021-09-22 DIAGNOSIS — R2689 Other abnormalities of gait and mobility: Secondary | ICD-10-CM

## 2021-09-22 NOTE — Therapy (Signed)
[place], [place], [address] Phone: [phone]   Fax:  [phone]  Physical Therapy Treatment/Discharge  Patient Details  Name: Tiffany Mullins MRN: [id number] Date of Birth: Feb 06, 1966 Referring Provider (PT): Criselda Peaches, [place]   Encounter Date: 09/22/2021   PT End of Session - 09/22/21 1130     Visit Number 13    Number of Visits 16    Date for PT Re-Evaluation 09/22/21    Authorization Type BCBS    PT Start Time 1130    PT Stop Time 1208    PT Time Calculation (min) 38 min    Activity Tolerance Patient tolerated treatment well    Behavior During Therapy [facility] for tasks assessed/performed             Past Medical History:  Diagnosis Date   Allergic rhinitis    Carpal tunnel syndrome    GERD (gastroesophageal reflux disease)    Hypertension    Left sided sciatica    Morbid obesity ([place])    Vitamin D deficiency     Past Surgical History:  Procedure Laterality Date   BREAST BIOPSY Left     There were no vitals filed for this visit.   Subjective Assessment - 09/22/21 1130     Subjective Pt presents to PT with reports of L foot stiffness and very slight discomfort. Feels overall she is doing a good bit better compared to where she was pre-PT intervention. Pt is ready to begin discharge assessment at this time.    Currently in Pain? Yes    Pain Score 1     Pain Location Foot    Pain Orientation Left           [name]:   Therapeutic Exercise: NuStep lvl 7 LE only x 4 min while taking subjective Standing heel-toe raises x20 Slant board stretch x30" Standing L calf stretch against wall x30" Standing L soleus stretch x30"  L ankle 4 way BTB x 15 ea Seated fwd ball rollout x 10 - 5" Bridge x 10 - 3" LTR x 10 - 3" Standing hip abd/ext x 10 ea RTB   Therapeutic Activity:  Assessment of tests/measures, goals, and outcomes for purposes of  discharge     [facility] PT Assessment - 09/22/21 0001       Observation/Other Assessments   Focus on Therapeutic Outcomes (FOTO)  55% function      Sit to Stand   Comments 30'' STS: 12x    UE used? N      Other:   Other/ Comments 10 m max gait speed: 20'', .9 m/s, AD: none      AROM   Left Ankle Dorsiflexion 10                                    PT Education - 09/22/21 1211     Education Details final HEP and discharge plan    Person(s) Educated Patient    Methods Explanation;Demonstration;Handout    Comprehension Verbalized understanding;Returned demonstration              PT Short Term Goals - 09/10/21 1306       PT SHORT TERM GOAL #1   Title Gabriel Rainwater will be >75% HEP compliant to improve carryover between sessions and facilitate independent management of condition    Status Achieved  Target Date 08/18/21               PT Long Term Goals - 09/22/21 1142       PT LONG TERM GOAL #1   Title Gabriel Rainwater will improve 10 meter max gait speed to .8 m/s (.1 m/s MCID) to show functional improvement in ambulation    EVAL: .5 m/s    target date: 09/22/21    Baseline .10ms with no AD or CAM boot on 12/28    Status Achieved      PT LONG TERM GOAL #2   Title Jahna G Stoecker will improve 30'' STS (MCID 2) to >/= 12x (w/ UE: N) to show improved LE strength and improved transfers    EVAL: 10x  w/ UE? Y     target date: 09/22/21    Baseline 12 reps on 12/28 - no UE use    Status Achieved      PT LONG TERM GOAL #3   Title Jezebelle G Feldhaus will improve FOTO score from 55 (on evaluation) to 698as a proxy for functional improvement    target date: 09/22/21    Status Not Met      PT LONG TERM GOAL #4   Title Giulliana G Holshouser will be able to stand for >30'' in tandem stance, to show a significant improvement in balance in order to reduce fall risk    EVAL: unable    target date: 09/22/21    Baseline 30 sec w/ UE support needed  twice    Status Partially Met      PT LONG TERM GOAL #5   Title Rubena G Legendre will achieve greater than 40 degrees of closed chain left ankle DF of the affected ankle (inclinometer placed just distal to tibial tuberosity; "zeroed" vertically; 1/2 kneeling if able; measured on forward leg    ideal is between 40 - 50 degrees) to normalize terminal stance phase of gait and reduce stress on posterior ankle structures    EVAL: n/t d/t precautions but shows deficit in DF L>R    target date: 09/22/21    Status Achieved                   Plan - 09/22/21 1214     Clinical Impression Statement Pt was able to complete all precribed exercises and demonstrated knowledge of HEP with no adverse effect. Over the course of PT treamtent, pt has been able to greatly improve mobility and gait, as evidenced by increase in gait speed and increasing reps in 30"STS. She does continue to have L ankle stiffness and pain that affects her gait with longer distance walking, but does improve post gastroc and soleus stretching. Despite no change in FOTO score and continued need for occasional UE assistance in tandem, pt is appropriate for discharge at this time due to meeting all other objective measures tallied for LTGs. She should continue to improve with HEP compliance and is being discharged from skilled PT at this time.    PT Treatment/Interventions ADLs/Self Care Home Management;Aquatic Therapy;Iontophoresis 488mml Dexamethasone;Electrical Stimulation;Gait training;Therapeutic activities;Therapeutic exercise;Neuromuscular re-education;Manual techniques;Dry needling;Vasopneumatic Device;Spinal Manipulations;Joint Manipulations    PT Home Exercise Plan [id number]    Consulted and Agree with Plan of Care Patient             Patient will benefit from skilled therapeutic intervention in order to improve the following deficits and impairments:  Abnormal gait, Decreased balance, Decreased endurance, Difficulty  walking, Pain, Decreased  strength, Decreased range of motion  Visit Diagnosis: Pain in left foot  Muscle weakness  Balance problem  Other abnormalities of gait and mobility     Problem List Patient Active Problem List   Diagnosis Date Noted   Leg edema, left 12/29/2016   Varicose veins of leg with pain, left 12/29/2016   Varicose veins with pain 12/29/2016   Chronic venous insufficiency 07/17/2014   Morbid obesity due to excess calories ([place]) 07/17/2014    Ward Chatters, PT 09/22/2021, 12:18 PM  [facility] [address] [place], [place], [address] Phone: [phone]   Fax:  [phone]  Name: SYDNI ELIZARRARAZ MRN: [id number] Date of Birth: [birth year]   PHYSICAL THERAPY DISCHARGE SUMMARY  Visits from Start of Care: 13  Current functional level related to goals / functional outcomes: See goals and objective   Remaining deficits: See goals and objective   Education / Equipment: See goals and objective   Patient agrees to discharge. Patient goals were  mostly met . Patient is being discharged due to being pleased with the current functional level.

## 2021-10-02 ENCOUNTER — Encounter (HOSPITAL_COMMUNITY): Payer: Self-pay | Admitting: Emergency Medicine

## 2021-10-02 ENCOUNTER — Ambulatory Visit (HOSPITAL_COMMUNITY)
Admission: EM | Admit: 2021-10-02 | Discharge: 2021-10-02 | Disposition: A | Discharge status: Home / Self Care | Payer: BC Managed Care – PPO | Attending: Urgent Care | Admitting: Urgent Care

## 2021-10-02 ENCOUNTER — Other Ambulatory Visit: Payer: Self-pay

## 2021-10-02 DIAGNOSIS — Z20822 Contact with and (suspected) exposure to covid-19: Secondary | ICD-10-CM | POA: Diagnosis not present

## 2021-10-02 DIAGNOSIS — J0120 Acute ethmoidal sinusitis, unspecified: Secondary | ICD-10-CM

## 2021-10-02 DIAGNOSIS — J3489 Other specified disorders of nose and nasal sinuses: Secondary | ICD-10-CM | POA: Diagnosis not present

## 2021-10-02 MED ORDER — DOXYCYCLINE HYCLATE 100 MG PO CAPS: 100.0000 mg | ORAL_CAPSULE | Freq: Two times a day (BID) | ORAL | 0 refills | Status: AC

## 2021-10-02 MED ORDER — PREDNISONE 20 MG PO TABS: 20.0000 mg | ORAL_TABLET | Freq: Every day | ORAL | 0 refills | Status: AC

## 2021-10-02 NOTE — ED Provider Notes (Signed)
[facility]    CSN: [id number] Arrival date & time: 10/02/21  1122      History   Chief Complaint Chief Complaint  Patient presents with   Facial Pain   Cough    HPI Tiffany Mullins is a 56 y.o. female.   Pleasant 55yo female presents today with 3 day hx of sinus pain and congestion. She states sx started out with mild discomfort Thursday evening, but has progressed to severe pain between her eyes, dry nose, bleeding from her right nostril, and cough.  She states the cough started out with a dark green mucus, changed to yellow and is now white.  She states this morning it was blood-tinged x1 episode.  She is no longer taking a blood thinner.  Only prescription medication is HCTZ. 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 Her primary concern however is exposure to covid. Her last date of exposure was on Sunday this past week.  Patient took a home COVID test 2 days after her exposure, and then again 5 days after her exposure.  She is requesting a COVID test today as she takes care of her father who is significantly ill and wants to make sure she does not give him anything. She denies fever, SOB, CP, palpitations, body aches, anosmia.   Cough Associated symptoms: no chills, no diaphoresis, no ear pain, no fever, no shortness of breath, no sore throat and no wheezing    Past Medical History:  Diagnosis Date   Allergic rhinitis    Carpal tunnel syndrome    GERD (gastroesophageal reflux disease)    Hypertension    Left sided sciatica    Morbid obesity (HCC)    Vitamin D deficiency     Patient Active Problem List   Diagnosis Date Noted   Leg edema, left 12/29/2016   Varicose veins of leg with pain, left 12/29/2016   Varicose veins with pain 12/29/2016   Chronic venous insufficiency 07/17/2014   Morbid obesity due to excess calories (HCC) 07/17/2014    Past Surgical History:  Procedure Laterality Date   BREAST  BIOPSY Left     OB History   No obstetric history on file.      Home Medications    Prior to Admission medications   Medication Sig Start Date End Date Taking? Authorizing Provider  doxycycline (VIBRAMYCIN) 100 MG capsule Take 1 capsule (100 mg total) by mouth 2 (two) times daily for 10 days. 10/02/21 10/12/21 Yes Mindy Behnken L, PA  predniSONE (DELTASONE) 20 MG tablet Take 1 tablet (20 mg total) by mouth daily with breakfast for 5 days. 10/02/21 10/07/21 Yes Tayveon Lombardo L, PA  Cholecalciferol (VITAMIN D3) 1000 units CAPS Take by mouth.    [provider]  ELDERBERRY PO Take by mouth daily. 2 teaspoons every morning    [provider]  hydrochlorothiazide (HYDRODIURIL) 25 MG tablet Take 25 mg by mouth daily. 02/23/18   [provider]  vitamin B-12 (CYANOCOBALAMIN) 500 MCG tablet Take 1,000 mcg by mouth daily.    [provider]  vitamin C (ASCORBIC ACID) 500 MG tablet Take 500 mg by mouth daily.    [provider]    Family History Family History  Problem Relation Age of Onset   Diabetes Mother    Hypertension Mother    Heart attack Father        s/p CABG   Heart failure Father  Breast cancer Paternal Aunt     Social History Social History   Tobacco Use   Smoking status: Never   Smokeless tobacco: Never  Substance Use Topics   Alcohol use: Yes    Alcohol/week: 1.0 standard drink    Types: 1 Glasses of wine per week    Comment: rarely   Drug use: No     Allergies   Ciprofloxacin and Penicillins   Review of Systems Review of Systems  Constitutional: Negative.  Negative for chills, diaphoresis, fatigue and fever.  HENT:  Positive for congestion, nosebleeds (R nare), postnasal drip, sinus pressure and sinus pain. Negative for ear discharge, ear pain, sneezing, sore throat, tinnitus and trouble swallowing.        Dry mouth  Eyes: Negative.   Respiratory:  Positive for cough. Negative for chest tightness, shortness of  breath and wheezing.   Cardiovascular: Negative.   Gastrointestinal: Negative.   Genitourinary: Negative.   Musculoskeletal: Negative.   Neurological: Negative.     Physical Exam Triage Vital Signs ED Triage Vitals  Enc Vitals Group     BP 10/02/21 1349 134/85     Pulse Rate 10/02/21 1349 88     Resp 10/02/21 1349 18     Temp 10/02/21 1349 98.9 F (37.2 C)     Temp Source 10/02/21 1349 Oral     SpO2 10/02/21 1349 98 %     Weight --      Height --      Head Circumference --      Peak Flow --      Pain Score 10/02/21 1348 4     Pain Loc --      Pain Edu? --      Excl. in GC? --    No data found.  Updated Vital Signs BP 134/85 (BP Location: Left Arm)    Pulse 88    Temp 98.9 F (37.2 C) (Oral)    Resp 18    SpO2 98%   Visual Acuity Right Eye Distance:   Left Eye Distance:   Bilateral Distance:    Right Eye Near:   Left Eye Near:    Bilateral Near:     Physical Exam Vitals and nursing note reviewed.  Constitutional:      General: She is not in acute distress.    Appearance: She is well-developed. She is obese. She is not ill-appearing, toxic-appearing or diaphoretic.  HENT:     Head: Normocephalic and atraumatic.     Right Ear: Tympanic membrane, ear canal and external ear normal. There is no impacted cerumen.     Left Ear: Tympanic membrane, ear canal and external ear normal. There is no impacted cerumen.     Nose: Nose normal. No congestion or rhinorrhea.     Left Turbinates: Hypertrophy: bilateral ethmoid sinus tenderness.     Comments: Very dry nasal passages, evidence of previous epistaxis from R nare, but no active bleeding    Mouth/Throat:     Mouth: Mucous membranes are dry.     Pharynx: Oropharynx is clear. No oropharyngeal exudate or posterior oropharyngeal erythema.  Eyes:     General: No scleral icterus.       Right eye: No discharge.        Left eye: No discharge.     Extraocular Movements: Extraocular movements intact.     Conjunctiva/sclera:  Conjunctivae normal.     Pupils: Pupils are equal, round, and reactive to light.  Cardiovascular:  Rate and Rhythm: Normal rate and regular rhythm.     Pulses: Normal pulses.     Heart sounds: Normal heart sounds. No murmur heard.   No friction rub.  Pulmonary:     Effort: Pulmonary effort is normal. No accessory muscle usage, respiratory distress or retractions.     Breath sounds: Normal breath sounds and air entry. No stridor, decreased air movement or transmitted upper airway sounds. No decreased breath sounds, wheezing, rhonchi or rales.  Chest:     Chest wall: No tenderness.  Abdominal:     Palpations: Abdomen is soft.     Tenderness: There is no abdominal tenderness.  Musculoskeletal:        General: No swelling.     Cervical back: Normal range of motion and neck supple. No rigidity or tenderness.  Lymphadenopathy:     Cervical: No cervical adenopathy.  Skin:    General: Skin is warm and dry.     Capillary Refill: Capillary refill takes less than 2 seconds.  Neurological:     Mental Status: She is alert.  Psychiatric:        Mood and Affect: Mood normal.     UC Treatments / Results  Labs (all labs ordered are listed, but only abnormal results are displayed) Labs Reviewed  SARS CORONAVIRUS 2 (TAT 6-24 HRS)    EKG   Radiology No results found.  Procedures Procedures (including critical care time)  Medications Ordered in UC Medications - No data to display  Initial Impression / Assessment and Plan / UC Course  I have reviewed the triage vital signs and the nursing notes.  Pertinent labs & imaging results that were available during my care of the patient were reviewed by me and considered in my medical decision making (see chart for details).     Ethmoid sinusitis - likely viral. Complete steroid pack first. Must increase hydration. Saline sprays. Avoid Rx nasal sprays given recent epistaxis Exposure to covid - await PCR test results Dry nose - likely  the cause of the blood in sputum this morning. Lung exam benign. Nasogel or rhinaris. Avoid antihistamines. Follow up with PCP should sx persist, may also be related to pt's HCTZ.  Final Clinical Impressions(s) / UC Diagnoses   Final diagnoses:  Acute non-recurrent ethmoidal sinusitis  Exposure to COVID-19 virus  Dry nose     Discharge Instructions      You were tested today for COVID.  We will call with the results.  Avoid any extensive exposures to others until results received. Your symptoms sound consistent with ethmoid sinusitis. Start taking prednisone daily x5 days.  Take in the morning to prevent insomnia.  Avoid decongestants while on and monitor blood pressure. Do not take antihistamines, this will worsen your dry mouth. Purchase over-the-counter Nasogel or Rhinaris, this is a gel that will help humidify your nasal passages and prevent nosebleeds. An antibiotic was called in for you - please do not fill or start taking until the steroids are completed. You may not require treatment with them. Side effects include vaginal yeast infection and diarrhea. Only take if your symptoms persist or worsen after completion of your steroids.     ED Prescriptions     Medication Sig Dispense Auth. Provider   predniSONE (DELTASONE) 20 MG tablet Take 1 tablet (20 mg total) by mouth daily with breakfast for 5 days. 5 tablet Amariana Mirando L, PA   doxycycline (VIBRAMYCIN) 100 MG capsule Take 1 capsule (100 mg total) by mouth  2 (two) times daily for 10 days. 20 capsule Jojo Geving L, PA      PDMP not reviewed this encounter.   Maretta Bees, [place] 10/02/21 1440

## 2021-10-02 NOTE — Discharge Instructions (Addendum)
You were tested today for COVID.  We will call with the results.  Avoid any extensive exposures to others until results received. Your symptoms sound consistent with ethmoid sinusitis. Start taking prednisone daily x5 days.  Take in the morning to prevent insomnia.  Avoid decongestants while on and monitor blood pressure. Do not take antihistamines, this will worsen your dry mouth. Purchase over-the-counter Nasogel or Rhinaris, this is a gel that will help humidify your nasal passages and prevent nosebleeds. An antibiotic was called in for you - please do not fill or start taking until the steroids are completed. You may not require treatment with them. Side effects include vaginal yeast infection and diarrhea. Only take if your symptoms persist or worsen after completion of your steroids.

## 2021-10-02 NOTE — ED Triage Notes (Signed)
Pt reports sinus pressure and congestion Thursday night. Reports that took two covid test this week that were negative after being exposed. Reports coughed up blood today.

## 2021-10-03 LAB — SARS CORONAVIRUS 2 (TAT 6-24 HRS): SARS Coronavirus 2: POSITIVE — AB

## 2021-11-04 ENCOUNTER — Encounter: Payer: BC Managed Care – PPO | Admitting: Podiatry

## 2021-11-25 ENCOUNTER — Other Ambulatory Visit: Payer: Self-pay

## 2021-11-25 ENCOUNTER — Ambulatory Visit: Payer: BC Managed Care – PPO | Admitting: Podiatry

## 2021-11-25 ENCOUNTER — Ambulatory Visit (INDEPENDENT_AMBULATORY_CARE_PROVIDER_SITE_OTHER): Payer: BC Managed Care – PPO

## 2021-11-25 DIAGNOSIS — S99922D Unspecified injury of left foot, subsequent encounter: Secondary | ICD-10-CM

## 2021-11-25 DIAGNOSIS — S99922A Unspecified injury of left foot, initial encounter: Secondary | ICD-10-CM

## 2021-11-25 NOTE — Progress Notes (Signed)
?  Subjective:  ?Patient ID: Tiffany Mullins, female    DOB: 06-23-1966,  MRN: [id number] ? ?Chief Complaint  ?Patient presents with  ? Routine Post Op  ?  POV #5 DOS 05/14/2021 REPAIR LIGAMENTS IN TOES 3 POSS 2ND, FUSION  OF JOINT IN MIDFOOT W/BONE GRAFT FROM HEEL  ? ? ? ?56 y.o. female returns for follow-up of left foot swelling after surgery 05/14/21.  Overall doing well she still feels some tightness in the calf but is not necessarily painful.  When she stands up the second toe still drifts over some ? ?Review of Systems: Negative except as noted in the HPI. Denies N/V/F/Ch. ? ? ?Objective:  ?There were no vitals filed for this visit. ?There is no height or weight on file to calculate BMI. ?Constitutional Well developed. ?Well nourished.  ?Vascular Foot warm and well perfused. ?Capillary refill normal to all digits.   ?Neurologic Normal speech. ?Oriented to person, place, and time. ?Epicritic sensation to light touch grossly present bilaterally.  ?Dermatologic Incisions are well-healed and not hypertrophic  ?Orthopedic: No pain on palpation good range of motion she does have equinus deformity of the calf, on weightbearing some abduction deformity of the second toe  ? ?Multiple view plain film radiographs: New radiographs taken today show no change in hardware or surgical corrective alignment, some abduction of the second toe with good consolidation across arthrodesis site ?Assessment:  ? ?1. Injury of plantar plate of left foot, initial encounter   ?2. Injury of plantar plate of left foot, subsequent encounter   ? ?Plan:  ?Patient was evaluated and treated and all questions answered. ? ?S/p foot surgery left ?-I reviewed the x-rays with her today and overall she has done quite well and quite a bit of improvement with her foot since the initial deformity.  She has some tightness in the calf I recommended she continue stretching.  We discussed the option of a gastrocnemius recession but right now it is not  particular painful and I would recommend we hold off on surgical intervention for this unless it becomes worse.  She does have some slight abduction deformity of the second toe still and I dispensed a Budin splint to maintain alignment, overall much improved since surgical intervention and this may be as good as the toe alignment can get at this point.  I will see her back on an as-needed basis and have no further activity restrictions for her I did advise her to be cautious with impact exercises or Hyper extension exercises of the toe ? ?Return if symptoms worsen or fail to improve.  ? ?

## 2021-12-09 ENCOUNTER — Ambulatory Visit: Payer: BC Managed Care – PPO | Admitting: Orthopedic Surgery

## 2021-12-09 DIAGNOSIS — M7742 Metatarsalgia, left foot: Secondary | ICD-10-CM | POA: Diagnosis not present

## 2021-12-09 DIAGNOSIS — I83812 Varicose veins of left lower extremities with pain: Secondary | ICD-10-CM | POA: Diagnosis not present

## 2021-12-09 DIAGNOSIS — M6702 Short Achilles tendon (acquired), left ankle: Secondary | ICD-10-CM | POA: Diagnosis not present

## 2021-12-09 DIAGNOSIS — M205X2 Other deformities of toe(s) (acquired), left foot: Secondary | ICD-10-CM

## 2021-12-12 ENCOUNTER — Encounter: Payer: Self-pay | Admitting: Orthopedic Surgery

## 2021-12-12 NOTE — Progress Notes (Signed)
? ?Office Visit Note ?  ?Patient: Tiffany Mullins           ?Date of Birth: Jan 25, 1966           ?MRN: [id number] ?Visit Date: 12/09/2021 ?             ?Requested by: Darrow Bussing, MD ?[address] ?[address] ?[place],  [place] [address] ?PCP: Darrow Bussing, MD ? ?Chief Complaint  ?Patient presents with  ? Left Foot - Pain  ? ? ? ? ?HPI: ?Patient is a 56 year old woman who was seen for initial evaluation for left foot pain.  Patient is status post claw toe surgery as well as the fusion of the base of the second and third metatarsals.  Patient still has pain and has questions regarding potential future surgery. ? ?Assessment & Plan: ?Visit Diagnoses:  ?1. Varicose veins of leg with pain, left   ?2. Metatarsalgia of left foot   ?3. Achilles tendon contracture, left   ?4. Claw toe, acquired, left   ? ? ?Plan: Discussed that she does have a long second and third metatarsal with subluxation of the MTP joint of the second toe.  Discussed that surgical intervention could include a Weil osteotomy for the second and third metatarsal and possible gastrocnemius recession.  Patient is not interested in surgical intervention at this time. ? ?Follow-Up Instructions: Return if symptoms worsen or fail to improve.  ? ?Ortho Exam ? ?Patient is alert, oriented, no adenopathy, well-dressed, normal affect, normal respiratory effort. ?Examination patient has good pulses she has dorsiflexion to neutral with her knee extended with some Achilles tightness.  She has valgus deviation of the second toe and fusion of the PIP joint of the third toe.  There is subluxation laterally of the MTP joint of the second toe.  There are no plantar ulcers no cellulitis no interdigital ulcers. ? ?Imaging: ?No results found. ?No images are attached to the encounter. ? ?Labs: ?No results found for: HGBA1C, ESRSEDRATE, CRP, LABURIC, REPTSTATUS, GRAMSTAIN, CULT, LABORGA ? ? ?No results found for: ALBUMIN, PREALBUMIN, CBC ? ?No results found for:  MG ?No results found for: VD25OH ? ?No results found for: PREALBUMIN ?No flowsheet data found. ? ? ?There is no height or weight on file to calculate BMI. ? ?Orders:  ?No orders of the defined types were placed in this encounter. ? ?No orders of the defined types were placed in this encounter. ? ? ? Procedures: ?No procedures performed ? ?Clinical Data: ?No additional findings. ? ?ROS: ? ?All other systems negative, except as noted in the HPI. ?Review of Systems ? ?Objective: ?Vital Signs: There were no vitals taken for this visit. ? ?Specialty Comments:  ?No specialty comments available. ? ?PMFS History: ?Patient Active Problem List  ? Diagnosis Date Noted  ? Leg edema, left 12/29/2016  ? Varicose veins of leg with pain, left 12/29/2016  ? Varicose veins with pain 12/29/2016  ? Chronic venous insufficiency 07/17/2014  ? Morbid obesity due to excess calories (HCC) 07/17/2014  ? ?Past Medical History:  ?Diagnosis Date  ? Allergic rhinitis   ? Carpal tunnel syndrome   ? GERD (gastroesophageal reflux disease)   ? Hypertension   ? Left sided sciatica   ? Morbid obesity (HCC)   ? Vitamin D deficiency   ?  ?Family History  ?Problem Relation Age of Onset  ? Diabetes Mother   ? Hypertension Mother   ? Heart attack Father   ?     s/p CABG  ?  Heart failure Father   ? Breast cancer Paternal Aunt   ?  ?Past Surgical History:  ?Procedure Laterality Date  ? BREAST BIOPSY Left   ? ?Social History  ? ?Occupational History  ? Not on file  ?Tobacco Use  ? Smoking status: Never  ? Smokeless tobacco: Never  ?Substance and Sexual Activity  ? Alcohol use: Yes  ?  Alcohol/week: 1.0 standard drink  ?  Types: 1 Glasses of wine per week  ?  Comment: rarely  ? Drug use: No  ? Sexual activity: Yes  ? ? ? ? ? ?

## 2021-12-16 ENCOUNTER — Other Ambulatory Visit: Payer: Self-pay | Admitting: Family Medicine

## 2021-12-16 DIAGNOSIS — Z1231 Encounter for screening mammogram for malignant neoplasm of breast: Secondary | ICD-10-CM

## 2022-01-10 ENCOUNTER — Ambulatory Visit
Admission: RE | Admit: 2022-01-10 | Discharge: 2022-01-10 | Disposition: A | Discharge status: Home / Self Care | Payer: BC Managed Care – PPO | Source: Ambulatory Visit | Attending: Family Medicine | Admitting: Family Medicine

## 2022-01-10 DIAGNOSIS — Z1231 Encounter for screening mammogram for malignant neoplasm of breast: Secondary | ICD-10-CM

## 2022-12-29 ENCOUNTER — Other Ambulatory Visit: Payer: Self-pay | Admitting: Family Medicine

## 2022-12-29 DIAGNOSIS — Z1231 Encounter for screening mammogram for malignant neoplasm of breast: Secondary | ICD-10-CM

## 2023-02-14 ENCOUNTER — Ambulatory Visit
Admission: RE | Admit: 2023-02-14 | Discharge: 2023-02-14 | Disposition: A | Discharge status: Home / Self Care | Payer: BC Managed Care – PPO | Source: Ambulatory Visit | Attending: Family Medicine | Admitting: Family Medicine

## 2023-02-14 DIAGNOSIS — Z1231 Encounter for screening mammogram for malignant neoplasm of breast: Secondary | ICD-10-CM

## 2023-04-07 ENCOUNTER — Ambulatory Visit (INDEPENDENT_AMBULATORY_CARE_PROVIDER_SITE_OTHER): Payer: BC Managed Care – PPO

## 2023-04-07 ENCOUNTER — Ambulatory Visit: Payer: BC Managed Care – PPO | Admitting: Podiatry

## 2023-04-07 DIAGNOSIS — M79672 Pain in left foot: Secondary | ICD-10-CM | POA: Diagnosis not present

## 2023-04-07 DIAGNOSIS — M7672 Peroneal tendinitis, left leg: Secondary | ICD-10-CM | POA: Diagnosis not present

## 2023-04-07 MED ORDER — MELOXICAM 15 MG PO TABS: 15.0000 mg | ORAL_TABLET | Freq: Every day | ORAL | 0 refills | Status: DC

## 2023-04-07 NOTE — Progress Notes (Signed)
  Subjective:  Patient ID: Tiffany Mullins, female    DOB: 07-24-1966,  MRN: [id number]  Chief Complaint  Patient presents with   Foot Pain    Pt states she started wearing sandals and she started having pain on her left side of the left foot and she started doing exercises from when she went to rehab and iced it this morning    57 y.o. female presents with concern for pain in the left lateral rear foot.  She has pain recently after wearing a pair of sandals.  She has recently been stretching and doing foot exercises and icing the area which has helped.  History of prior midfoot surgery including second and third TMT J arthrodesis on the left knee.  Past Medical History:  Diagnosis Date   Allergic rhinitis    Carpal tunnel syndrome    GERD (gastroesophageal reflux disease)    Hypertension    Left sided sciatica    Morbid obesity (HCC)    Vitamin D deficiency     Allergies  Allergen Reactions   Ciprofloxacin     Other reaction(s): rash   Penicillins Rash    ROS: Negative except as per HPI above  Objective:  General: AAO x3, NAD  Dermatological: With inspection and palpation of the right and left lower extremities there are no open sores, no preulcerative lesions, no rash or signs of infection present. Nails are of normal length thickness and coloration.   Vascular:  Dorsalis Pedis artery and Posterior Tibial artery pedal pulses are 2/4 bilateral.  Capillary fill time < 3 sec to all digits.   Neruologic: Grossly intact via light touch bilateral. Protective threshold intact to all sites bilateral.   Musculoskeletal: Pain with patient on the course of the peroneal tendons on the left rear foot near the infra malleoli are area.  Gait: Unassisted, Nonantalgic.   No images are attached to the encounter.  Radiographs:  Date: 04/07/2023 XR the left foot Weightbearing AP/Lateral/Oblique   Findings: Arthrodesis of the second and third TMT J.  No evidence of complication there.   No fracture identified. Assessment:   1. Peroneal tendinitis of left lower leg   2. Left foot pain      Plan:  Patient was evaluated and treated and all questions answered.  #Peroneal tendinitis of left lower extremity -Discussed with the patient that she is exhibiting signs of peroneal tendinitis of the peroneus brevis and longus tendons. -Does have focal swelling over the area palpation and side-to-side motion of the foot -I recommend a steroid injection.  Patient was agreeable -After sterile alcohol prep injected 1 cc half percent Marcaine plain with 1 cc Kenalog 10 along the course of peroneal tendons inferior to the lateral malleolus. -Recommend immobilization in a cam boot which she has at home for the next 2 weeks and then transition back to regular good supportive shoes -Recommend icing and stretching to assist in healing -E Rx for meloxicam 15 mg take once daily for the next 3 days.  Return in about 4 weeks (around 05/05/2023) for f/u L peroneal tendinitis.          Corinna Gab, DPM [facility]

## 2023-04-10 ENCOUNTER — Other Ambulatory Visit: Payer: Self-pay | Admitting: Podiatry

## 2023-04-10 DIAGNOSIS — M7672 Peroneal tendinitis, left leg: Secondary | ICD-10-CM

## 2023-04-10 DIAGNOSIS — M79672 Pain in left foot: Secondary | ICD-10-CM

## 2023-05-04 ENCOUNTER — Other Ambulatory Visit: Payer: Self-pay | Admitting: Podiatry

## 2023-05-05 ENCOUNTER — Ambulatory Visit: Payer: BC Managed Care – PPO | Admitting: Podiatry

## 2023-09-14 ENCOUNTER — Encounter: Payer: Self-pay | Admitting: Podiatry

## 2023-09-14 ENCOUNTER — Ambulatory Visit: Payer: BC Managed Care – PPO | Admitting: Podiatry

## 2023-09-14 DIAGNOSIS — M7672 Peroneal tendinitis, left leg: Secondary | ICD-10-CM | POA: Diagnosis not present

## 2023-09-14 MED ORDER — METHYLPREDNISOLONE 4 MG PO TBPK: ORAL_TABLET | ORAL | 0 refills | Status: AC

## 2023-09-14 NOTE — Progress Notes (Signed)
Subjective: Chief Complaint  Patient presents with   Foot Pain    RM#12 Patient states having left foot pain thinks the type of shoe could aggravated it. 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   57 year old female presents the office with above concerns.  She states she got new shoes which may have aggravated her symptoms.  She has been started to swell on the lateral aspect and hurting.  Hurts in the morning to walk on.  This with the sharp pain going to the big toe.  She is not sure as to what appears to ice and elevate her last couple of weeks.  She does wear compression her shoes on the [place] side.  Follows with [facility] surgery   Objective: AAO x3, NAD DP/PT pulses palpable bilaterally, CRT less than 3 seconds There is tenderness palpation on the distal portion of peroneal tendon posterior inferior to lateral malleolus.  Clinically the tendon appears to be intact.  There is some about edema present erythema.  There is no area pinpoint tenderness.  Ankle, subtalar joint range of motion intact.  MMT 5/5. No pain with calf compression, swelling, warmth, erythema  Assessment: Peroneal tendinitis  Plan: -All treatment options discussed with the patient including all alternatives, risks, complications.  -Given her symptoms.  She also had similar issues in July discussed MRI.  There is likely flareup from wearing new shoes or to hold off on this for now but symptoms persist would recommend MRI.  Prescribed Medrol Dosepak.  Discussed ankle brace short-term.  Continue range of motion, rehab exercises.  Injury at home.  Discussed proper shoe gear, good support. -Patient encouraged to call the office with any questions, concerns, change in symptoms.   No follow-ups on file.  Vivi Barrack DPM

## 2023-09-26 ENCOUNTER — Other Ambulatory Visit (HOSPITAL_COMMUNITY)
Admission: RE | Admit: 2023-09-26 | Discharge: 2023-09-26 | Disposition: A | Discharge status: Home / Self Care | Payer: BC Managed Care – PPO | Source: Ambulatory Visit | Attending: Nurse Practitioner | Admitting: Nurse Practitioner

## 2023-09-26 DIAGNOSIS — Z124 Encounter for screening for malignant neoplasm of cervix: Secondary | ICD-10-CM | POA: Diagnosis present

## 2023-10-03 LAB — CYTOLOGY - PAP
Comment: NEGATIVE
Diagnosis: NEGATIVE
High risk HPV: NEGATIVE

## 2024-01-17 ENCOUNTER — Other Ambulatory Visit: Payer: Self-pay | Admitting: Family Medicine

## 2024-01-17 DIAGNOSIS — Z1231 Encounter for screening mammogram for malignant neoplasm of breast: Secondary | ICD-10-CM

## 2024-02-15 ENCOUNTER — Ambulatory Visit
Admission: RE | Admit: 2024-02-15 | Discharge: 2024-02-15 | Disposition: A | Discharge status: Home / Self Care | Payer: Self-pay | Source: Ambulatory Visit | Attending: Family Medicine | Admitting: Family Medicine

## 2024-02-15 DIAGNOSIS — Z1231 Encounter for screening mammogram for malignant neoplasm of breast: Secondary | ICD-10-CM

## 2024-08-29 ENCOUNTER — Ambulatory Visit (INDEPENDENT_AMBULATORY_CARE_PROVIDER_SITE_OTHER): Admitting: Physician Assistant

## 2024-08-29 ENCOUNTER — Encounter (INDEPENDENT_AMBULATORY_CARE_PROVIDER_SITE_OTHER): Payer: Self-pay | Admitting: Physician Assistant

## 2024-08-29 VITALS — BP 125/76 | HR 70 | Temp 97.4°F | Ht 67.0 in | Wt 232.0 lb

## 2024-08-29 DIAGNOSIS — H9201 Otalgia, right ear: Secondary | ICD-10-CM

## 2024-08-29 DIAGNOSIS — J302 Other seasonal allergic rhinitis: Secondary | ICD-10-CM

## 2024-08-29 MED ORDER — FLUTICASONE PROPIONATE 50 MCG/ACT NA SUSP: 2.0000 | Freq: Every day | NASAL | 6 refills | Status: AC

## 2024-08-29 MED ORDER — LORATADINE 10 MG PO TABS: 10.0000 mg | ORAL_TABLET | Freq: Every day | ORAL | 11 refills | Status: AC

## 2024-08-29 NOTE — Progress Notes (Signed)
 Dear Dr. Dyane, Here is my assessment for our mutual patient, Tiffany Mullins. Thank you for allowing me the opportunity to care for your patient. Please do not hesitate to contact me should you have any other questions. Sincerely, Chyrl Cohen PA-C  Otolaryngology Clinic Note Referring provider: Dr. Dyane HPI:  Tiffany Mullins is a 58 y.o. female kindly referred by Dr. Dyane   Discussed the use of AI scribe software for clinical note transcription with the patient, who gave verbal consent to proceed.  History of Present Illness   Tiffany Mullins is a 58 year old female who presents with a two-month history of right earache and ringing.  For the past two months, she has experienced an earache in her right ear, which progressed to include ringing. The ringing was severe enough to wake her from sleep and persisted for several days before subsiding. She initially used prescribed ear drops for four days, but discontinued them after the ringing intensified.  Recently, she experienced a sinus issue, which she believes was exacerbated by exposure to cold weather. This led to a recurrence of the ear ringing and pain, this time affecting both ears, although the right ear remains the primary concern. She reports difficulty hearing from the right ear, which began around the same time as the earache.  Her past medical history includes frequent visits to an ENT for earwax removal, but no history of ear infections as an adult. She used to perform regular ear cleaning with peroxide and warm water but has not done so recently. No trauma to the ears, surgeries, or chronic sinus issues, although she experiences episodic sinus congestion and pressure, often related to weather changes.  She does not take regular medication for sinus or allergy issues, but finds relief with ginger candy. She recently had symptoms of a cold, including sneezing and a runny nose, which she managed with chloracetin, tea, and ginger.  She currently reports that her hearing is clear and the earache is mild.  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         Independent Review of Additional Tests or Records:  none   PMH/Meds/All/SocHx/FamHx/ROS:   Past Medical History:  Diagnosis Date   Allergic rhinitis    Carpal tunnel syndrome    GERD (gastroesophageal reflux disease)    Hypertension    Left sided sciatica    Morbid obesity (HCC)    Vitamin D deficiency      Past Surgical History:  Procedure Laterality Date   BREAST BIOPSY Left     Family History  Problem Relation Age of Onset   Diabetes Mother    Hypertension Mother    Heart attack Father        s/p CABG   Heart failure Father    Breast cancer Paternal Aunt    Breast cancer Cousin    Breast cancer Cousin      Social Connections: Patient Declined (09/04/2023)   [facility]   Social Network    How would you rate your social network (family, work, friends)?: Patient declined      Current Outpatient Medications:    benzonatate (TESSALON) 200 MG capsule, Take 200 mg by mouth 3 (three) times daily as needed., Disp: , Rfl:    Cholecalciferol (VITAMIN D3) 1000 units CAPS, Take by mouth., Disp: , Rfl:    ELDERBERRY PO, Take by mouth daily. 2 teaspoons every morning, Disp: , Rfl:  fluticasone (FLONASE) 50 MCG/ACT nasal spray, Place 2 sprays into both nostrils daily., Disp: 16 g, Rfl: 6   hydrochlorothiazide (HYDRODIURIL) 25 MG tablet, Take 25 mg by mouth daily., Disp: , Rfl: 0   loratadine (CLARITIN) 10 MG tablet, Take 1 tablet (10 mg total) by mouth daily., Disp: 30 tablet, Rfl: 11   meloxicam  (MOBIC ) 15 MG tablet, TAKE 1 TABLET (15 MG TOTAL) BY MOUTH DAILY., Disp: 30 tablet, Rfl: 0   methylPREDNISolone  (MEDROL  DOSEPAK) 4 MG TBPK tablet, Take as directed, Disp: 21 tablet, Rfl: 0   vitamin B-12 (CYANOCOBALAMIN) 500 MCG tablet, Take 1,000 mcg by mouth daily., Disp: , Rfl:    vitamin C  (ASCORBIC ACID) 500 MG tablet, Take 500 mg by mouth daily., Disp: , Rfl:    Physical Exam:   BP 125/76   Pulse 70   Temp (!) 97.4 F (36.3 C)   Ht 5' 7 (1.702 m)   Wt 232 lb (105.2 kg)   SpO2 99%   BMI 36.34 kg/m   Pertinent Findings  CN II-XII grossly intact Bilateral EAC clear and TM intact with well pneumatized middle ear spaces Anterior rhinoscopy: Septum midline; bilateral inferior turbinates with moderate hypertrophy No lesions of oral cavity/oropharynx; dentition wnl No obviously palpable neck masses/lymphadenopathy/thyromegaly No respiratory distress or stridor No pain at TMJ   Seprately Identifiable Procedures:  None  Impression & Plans:  Kesa Birky is a 58 y.o. female with the following   Assessment and Plan    Right ear pain and tinnitus Intermittent right ear pain and tinnitus for two months, with recent exacerbation. No infection. - Ordered hearing test  - Prescribed Flonase for nasal  - Advised saline sinus irrigation with distilled water. - Recommended daily antihistamine. - Continue treatment until symptoms resolve, potentially up to three months.  rhinitis Intermittent sinus congestion and pressure due to seasonal allergies and viral UIR. Symptoms exacerbated by environmental factors. - Prescribed Flonase for nasal inflammation. - Advised saline sinus irrigation with distilled water. - Recommended daily antihistamine.        - f/u phone call with audio results   Thank you for allowing me the opportunity to care for your patient. Please do not hesitate to contact me should you have any other questions.  Sincerely, Chyrl Cohen PA-C [facility] Phone: [phone] Fax: [phone]  08/29/2024, 2:09 PM

## 2024-10-17 ENCOUNTER — Ambulatory Visit (INDEPENDENT_AMBULATORY_CARE_PROVIDER_SITE_OTHER): Admitting: Audiology
